# Patient Record
Sex: Male | Born: 1960
Health system: Southern US, Community
[De-identification: ages and names within clinical notes are randomized; demographics above are authoritative.]

## PROBLEM LIST (undated history)

## (undated) DIAGNOSIS — G43909 Migraine, unspecified, not intractable, without status migrainosus: Secondary | ICD-10-CM

## (undated) DIAGNOSIS — F329 Major depressive disorder, single episode, unspecified: Secondary | ICD-10-CM

## (undated) DIAGNOSIS — F32A Depression, unspecified: Secondary | ICD-10-CM

## (undated) DIAGNOSIS — K219 Gastro-esophageal reflux disease without esophagitis: Secondary | ICD-10-CM

## (undated) DIAGNOSIS — J45909 Unspecified asthma, uncomplicated: Secondary | ICD-10-CM

## (undated) DIAGNOSIS — I10 Essential (primary) hypertension: Secondary | ICD-10-CM

## (undated) DIAGNOSIS — J302 Other seasonal allergic rhinitis: Secondary | ICD-10-CM

## (undated) DIAGNOSIS — E785 Hyperlipidemia, unspecified: Secondary | ICD-10-CM

## (undated) DIAGNOSIS — R0600 Dyspnea, unspecified: Secondary | ICD-10-CM

## (undated) DIAGNOSIS — E119 Type 2 diabetes mellitus without complications: Secondary | ICD-10-CM

## (undated) DIAGNOSIS — M1990 Unspecified osteoarthritis, unspecified site: Secondary | ICD-10-CM

## (undated) DIAGNOSIS — I509 Heart failure, unspecified: Secondary | ICD-10-CM

## (undated) DIAGNOSIS — J189 Pneumonia, unspecified organism: Secondary | ICD-10-CM

## (undated) HISTORY — PX: COLONOSCOPY: SHX174

## (undated) HISTORY — PX: OTHER SURGICAL HISTORY: SHX169

## (undated) HISTORY — DX: Migraine, unspecified, not intractable, without status migrainosus: G43.909

## (undated) HISTORY — PX: HERNIA REPAIR: SHX51

---

## 1997-10-05 ENCOUNTER — Ambulatory Visit (HOSPITAL_BASED_OUTPATIENT_CLINIC_OR_DEPARTMENT_OTHER): Admission: RE | Admit: 1997-10-05 | Discharge: 1997-10-05 | Payer: Self-pay | Admitting: Surgery

## 2000-03-06 ENCOUNTER — Emergency Department (HOSPITAL_COMMUNITY): Admission: EM | Admit: 2000-03-06 | Discharge: 2000-03-06 | Payer: Self-pay | Admitting: Emergency Medicine

## 2008-06-15 ENCOUNTER — Encounter: Admission: RE | Admit: 2008-06-15 | Discharge: 2008-06-15 | Payer: Self-pay | Admitting: Emergency Medicine

## 2008-06-22 ENCOUNTER — Inpatient Hospital Stay (HOSPITAL_COMMUNITY): Admission: EM | Admit: 2008-06-22 | Discharge: 2008-06-23 | Payer: Self-pay | Admitting: Family Medicine

## 2008-06-22 ENCOUNTER — Encounter: Payer: Self-pay | Admitting: Emergency Medicine

## 2008-06-22 ENCOUNTER — Ambulatory Visit: Payer: Self-pay | Admitting: Family Medicine

## 2008-07-07 ENCOUNTER — Ambulatory Visit: Payer: Self-pay | Admitting: Gastroenterology

## 2008-08-10 ENCOUNTER — Encounter (INDEPENDENT_AMBULATORY_CARE_PROVIDER_SITE_OTHER): Payer: Self-pay | Admitting: Interventional Radiology

## 2008-08-10 ENCOUNTER — Ambulatory Visit (HOSPITAL_COMMUNITY): Admission: RE | Admit: 2008-08-10 | Discharge: 2008-08-10 | Payer: Self-pay | Admitting: Gastroenterology

## 2008-09-01 ENCOUNTER — Ambulatory Visit: Payer: Self-pay | Admitting: Gastroenterology

## 2009-03-23 ENCOUNTER — Ambulatory Visit: Payer: Self-pay | Admitting: Gastroenterology

## 2010-04-29 LAB — CBC
HCT: 44.5 % (ref 39.0–52.0)
Hemoglobin: 15.4 g/dL (ref 13.0–17.0)
MCHC: 34.7 g/dL (ref 30.0–36.0)
MCV: 94.6 fL (ref 78.0–100.0)
Platelets: 205 10*3/uL (ref 150–400)
RBC: 4.7 MIL/uL (ref 4.22–5.81)
RDW: 12.4 % (ref 11.5–15.5)
WBC: 5.9 10*3/uL (ref 4.0–10.5)

## 2010-04-29 LAB — PROTIME-INR
INR: 1 (ref 0.00–1.49)
Prothrombin Time: 13.3 seconds (ref 11.6–15.2)

## 2010-04-29 LAB — APTT: aPTT: 28 seconds (ref 24–37)

## 2010-04-30 LAB — COMPREHENSIVE METABOLIC PANEL
ALT: 90 U/L — ABNORMAL HIGH (ref 0–53)
ALT: 102 U/L — ABNORMAL HIGH (ref 0–53)
AST: 54 U/L — ABNORMAL HIGH (ref 0–37)
AST: 61 U/L — ABNORMAL HIGH (ref 0–37)
Albumin: 3 g/dL — ABNORMAL LOW (ref 3.5–5.2)
Albumin: 3.2 g/dL — ABNORMAL LOW (ref 3.5–5.2)
Alkaline Phosphatase: 37 U/L — ABNORMAL LOW (ref 39–117)
Alkaline Phosphatase: 41 U/L (ref 39–117)
BUN: 10 mg/dL (ref 6–23)
BUN: 12 mg/dL (ref 6–23)
CO2: 29 mEq/L (ref 19–32)
CO2: 28 mEq/L (ref 19–32)
Calcium: 8.7 mg/dL (ref 8.4–10.5)
Calcium: 8.6 mg/dL (ref 8.4–10.5)
Chloride: 108 mEq/L (ref 96–112)
Chloride: 112 mEq/L (ref 96–112)
Creatinine, Ser: 0.86 mg/dL (ref 0.4–1.5)
Creatinine, Ser: 0.81 mg/dL (ref 0.4–1.5)
GFR calc Af Amer: >60 mL/min (ref >60)
GFR calc Af Amer: >60 mL/min (ref >60)
GFR calc non Af Amer: >60 mL/min (ref >60)
GFR calc non Af Amer: >60 mL/min (ref >60)
Glucose, Bld: 94 mg/dL (ref 70–99)
Glucose, Bld: 86 mg/dL (ref 70–99)
Potassium: 4 mEq/L (ref 3.5–5.1)
Potassium: 3.8 mEq/L (ref 3.5–5.1)
Sodium: 143 mEq/L (ref 135–145)
Sodium: 143 mEq/L (ref 135–145)
Total Bilirubin: 1.1 mg/dL (ref 0.3–1.2)
Total Bilirubin: 1.2 mg/dL (ref 0.3–1.2)
Total Protein: 5.9 g/dL — ABNORMAL LOW (ref 6.0–8.3)
Total Protein: 5.9 g/dL — ABNORMAL LOW (ref 6.0–8.3)

## 2010-04-30 LAB — URINALYSIS, ROUTINE W REFLEX MICROSCOPIC
Bilirubin Urine: NEGATIVE
Glucose, UA: NEGATIVE mg/dL
Hgb urine dipstick: NEGATIVE
Ketones, ur: NEGATIVE mg/dL
Nitrite: NEGATIVE
Protein, ur: NEGATIVE mg/dL
Specific Gravity, Urine: 1.008 (ref 1.005–1.030)
Urobilinogen, UA: 0.2 mg/dL (ref 0.0–1.0)
pH: 8.5 — ABNORMAL HIGH (ref 5.0–8.0)

## 2010-04-30 LAB — CBC
HCT: 40.6 % (ref 39.0–52.0)
HCT: 41.8 % (ref 39.0–52.0)
Hemoglobin: 14.1 g/dL (ref 13.0–17.0)
Hemoglobin: 14.2 g/dL (ref 13.0–17.0)
MCHC: 34.6 g/dL (ref 30.0–36.0)
MCHC: 33.9 g/dL (ref 30.0–36.0)
MCV: 93.2 fL (ref 78.0–100.0)
MCV: 93.7 fL (ref 78.0–100.0)
Platelets: 154 10*3/uL (ref 150–400)
Platelets: 160 10*3/uL (ref 150–400)
RBC: 4.36 MIL/uL (ref 4.22–5.81)
RBC: 4.47 MIL/uL (ref 4.22–5.81)
RDW: 12.3 % (ref 11.5–15.5)
RDW: 12.4 % (ref 11.5–15.5)
WBC: 5.4 10*3/uL (ref 4.0–10.5)
WBC: 6.6 10*3/uL (ref 4.0–10.5)

## 2010-04-30 LAB — DIFFERENTIAL
Basophils Absolute: 0 10*3/uL (ref 0.0–0.1)
Basophils Relative: 0 % (ref 0–1)
Eosinophils Absolute: 0.2 10*3/uL (ref 0.0–0.7)
Eosinophils Relative: 2 % (ref 0–5)
Lymphocytes Relative: 34 % (ref 12–46)
Lymphs Abs: 2.2 10*3/uL (ref 0.7–4.0)
Monocytes Absolute: 0.4 10*3/uL (ref 0.1–1.0)
Monocytes Relative: 6 % (ref 3–12)
Neutro Abs: 3.8 10*3/uL (ref 1.7–7.7)
Neutrophils Relative %: 57 % (ref 43–77)

## 2010-04-30 LAB — PROTIME-INR
INR: 1.1 (ref 0.00–1.49)
Prothrombin Time: 14.7 seconds (ref 11.6–15.2)

## 2010-04-30 LAB — SEDIMENTATION RATE: Sed Rate: 2 mm/hr (ref 0–16)

## 2010-04-30 LAB — APTT: aPTT: 28 seconds (ref 24–37)

## 2010-04-30 LAB — LIPASE, BLOOD: Lipase: 15 U/L (ref 11–59)

## 2010-04-30 LAB — TSH: TSH: 0.717 u[IU]/mL (ref 0.350–4.500)

## 2010-06-05 NOTE — Discharge Summary (Signed)
NAMEJOSHAU, CODE               ACCOUNT NO.:  [id number]   MEDICAL RECORD NO.:  [id number]          PATIENT TYPE:  INP   LOCATION:  3730                         FACILITY:  [facility]   PHYSICIAN:  Santiago Bumpers. Hensel, M.D.DATE OF BIRTH:  Jul 19, 1960   DATE OF ADMISSION:  06/22/2008  DATE OF DISCHARGE:  06/23/2008                               DISCHARGE SUMMARY   PRIMARY CARE Cora Stetson:  Brett Canales A. Cleta Alberts, MD at [facility].   DISCHARGE DIAGNOSES:  1. Vertigo, resolved.  2. Chronic hepatitis C.   DISCHARGE MEDICATIONS:  None.   DISCONTINUED MEDICATIONS:  None.   PROCEDURES:  1. Acute abdominal series were unremarkable.  2. Two-view chest x-ray shows no acute findings.   LABORATORY DATA:  1. ESR 2.  2. TSH 0.717.  3. Complete metabolic panel:  Creatinine of 0.86.  Liver enzymes      mildly elevated at alkaline phosphatase 37, AST 54, ALT 90, total      protein 5.9, blood albumin 3, calcium 8.7, total bilirubin 1.1.      These were labs on day of discharge and showed an improvement and      showed a slight decrease in liver enzymes from one drawn on      admission.  4. PT 1.1.  5. Lipase 50.  6. Urinalysis negative for proteins, lymphocytes, nitrites.   BRIEF HOSPITAL COURSE:  Please see dictated H and P for full admission  details.  In brief, this is a 50 year old male with no significant past  history who presented to the hospital with several weeks of increasing  weakness and an acute episode of severe dizziness yesterday.   PROBLEMS:  1. Vertigo:  The patient was admitted and put on telemetry.  The      patient's vertigo soon resolved after some rehydration and good      p.o. intake.  Cardiac monitoring, EKG, labs did not show any causes      concerning for acute vertigo.  Possibly, the patient had an acute      viral infection superimposed on chronic hepatitis C, which would      explain slightly increased LFTs and possible viral labyrinthitis as      cause of his  dizziness.  The patient's symptoms not consistent with      benign positional vertigo.  2. Hepatitis C:  The patient's LFTs were slightly elevated on      admission.  No evidence of liver failure.  The patient will follow      up with his primary care physician to be set up to be referred to      [facility] for consultation for further treatment options.   FOLLOWUP APPOINTMENTS:  The patient is instructed to follow up with Dr.  Cleta Alberts in the next week.   The patient discharged to home in stable condition.      Delbert Harness, MD  Electronically Signed      Santiago Bumpers. Leveda Anna, M.D.  Electronically Signed    KB/MEDQ  D:  06/23/2008  T:  06/24/2008  Job:  [id number]  cc:   Stan Head. Cleta Alberts, M.D.

## 2010-06-05 NOTE — H&P (Signed)
Jonathan Grant, Jonathan Grant               ACCOUNT NO.:  [id number]   MEDICAL RECORD NO.:  [id number]          PATIENT TYPE:  INP   LOCATION:  3730                         FACILITY:  [facility]   PHYSICIAN:  Santiago Bumpers. Hensel, M.D.DATE OF BIRTH:  1960-08-30   DATE OF ADMISSION:  06/22/2008  DATE OF DISCHARGE:  06/23/2008                              HISTORY & PHYSICAL   PRIMARY CARE PHYSICIAN:  Brett Canales A. Cleta Alberts, MD, [facility]   CHIEF COMPLAINT:  Weakness and dizziness.   HISTORY OF PRESENT ILLNESS:  Mr. Knerr is 50 year old male with no  significant past medical history until yesterday when he was diagnosed  with hepatitis C.  He states he went to Dr. Cleta Alberts for his yearly work  physical little over a month ago and told him he had not been feeling  well.  He is feeling very warm down and extremely fatigued.  He was  started and Lexapro and blood work was drawn.  The patient states his  liver test and testosterone came back abnormal, so they sent more blood.  They thought he possibly had liver cancer, but then yesterday he told  them he had hepatitis C.  He also started the Lexapro about 1 week ago  because he had been feeling increasingly lightheaded and weak for the  last weeks.  Last night, he was mowing with his tractor, but he could  not finish because he felt so weak.  He went inside and ate dinner and  later his wife told him he looked flushed.  This morning, he woke at 6  a.m. and had severe dizziness and sensation of the room spinning and was  very nauseated.  He states he could barely walk.  He already had  followup appointment scheduled with Dr. Cleta Alberts, but when he arrived he was  so weak and dizzy he had to take a wheelchair to get inside, so we sent  him to the emergency department.   REVIEW OF SYSTEMS:  Positive for 28-pound weight loss since January;  however, he has been trying to lose weight.  He is also positive for hot  flushes or sweating and  constipation.  He is negative for cough,  shortness of breath, fevers, vomiting, diarrhea, dysuria, headache,  chest pain, and abdominal pain.   PAST MEDICAL HISTORY:  Hepatitis C.   MEDICATIONS:  Multivitamin daily and fish oil daily.   ALLERGIES:  He has no known drug allergies.   FAMILY HISTORY:  His father died from cirrhosis of the liver, but was a  heavy drinker.  His mother, siblings, and children are all healthy.   SOCIAL HISTORY:  He is married with 3 children, age is 96, 12, and 8.  He lives in [place].  He works as a Psychologist, occupational.  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  He smoked various  tobacco products between the ages of 84 and 31 and used various types of  IV drugs between ages of 47 and 89.   PHYSICAL EXAMINATION:  VITAL SIGNS:  Temperature 97.2, pulse 54,  respirations 20, blood pressure 119/77, and oxygen saturation 100% on  room air.  GENERAL:  He is a thin male in no acute distress.  HEENT:  Head is normocephalic and atraumatic with extraocular movements  intact.  Clear sclerae.  Mucous membranes are moist and his oropharynx  is clear.  NECK:  Supple with full range of motion and no lymphadenopathy.  CARDIOVASCULAR:  Sinus bradycardiac without murmur.  No JVD.  He has 2+  peripheral pulses.  LUNGS:  Clear to auscultation bilaterally.  ABDOMEN:  Soft, nontender, and nondistended with positive bowel sounds.  No hepatosplenomegaly.  SKIN:  He has good tone and color.  No jaundice.  No lesions.  EXTREMITIES:  No cyanosis, clubbing, or edema.   LABORATORY DATA:  The UA was normal with a specific gravity of 1.008.  His white count is 6.6, hemoglobin 14.2, platelets 160 with a normal  differential.  BMET shows the BUN of 12, creatinine is 0.81.  Liver  functions are normal except with mildly elevated AST at 61 and mildly  elevated ALT at 102.  He had a lipase that was normal at 15 and normal  INR of 1.1.  He had an acute abdominal  series, which was negative and ab  abdominal ultrasound on Jun 15, 2008, which was normal.   ASSESSMENT AND PLAN:  This is a 50 year old male with recently diagnosed  hepatitis C with dizziness and weakness.  1. Dizziness and weakness.  The etiology is unclear, could be      dehydration versus vertigo, versus malignancy.  Hepatitis is not      acute and does not necessarily explain this.  His albumin is down,      but his INR is normal.  He does not appear to have severe liver      dysfunction.  His abdominal ultrasound showed normal liver.  I will      give him a regular diet and IV fluids.  If dizziness returns, we      will try meclizine.  We will check a TSH and a sed rate.  If this      is not improving, we may need to consider more workup for possible      malignant process.  This seems to have indolently set in over the      last few weeks to months.  He does state that he saw something on      his chest x-ray that we will get a two-view chest x-ray in the      morning.  2. Bradycardia.  He is relatively young and has been exercising more      up until 2 weeks, we will check TSH and EKG.  3. Hepatitis C.  This is a new diagnosis and no indication for      treatment currently.  His primary MD is arranging followup with the      [facility].   DISPOSITION:  Pain resolution of his symptoms and any further workup  deemed necessary.      Ardeen Garland, MD  Electronically Signed      Santiago Bumpers. Leveda Anna, M.D.  Electronically Signed    LM/MEDQ  D:  06/23/2008  T:  06/24/2008  Job:  [id number]

## 2011-06-20 ENCOUNTER — Other Ambulatory Visit: Payer: Self-pay | Admitting: Dermatology

## 2011-10-31 DIAGNOSIS — K648 Other hemorrhoids: Secondary | ICD-10-CM | POA: Insufficient documentation

## 2015-06-03 ENCOUNTER — Emergency Department (HOSPITAL_COMMUNITY)
Admission: EM | Admit: 2015-06-03 | Discharge: 2015-06-03 | Disposition: A | Discharge status: Home / Self Care | Payer: Self-pay | Attending: Emergency Medicine | Admitting: Emergency Medicine

## 2015-06-03 ENCOUNTER — Encounter (HOSPITAL_COMMUNITY): Payer: Self-pay | Admitting: *Deleted

## 2015-06-03 ENCOUNTER — Emergency Department (HOSPITAL_COMMUNITY): Payer: Self-pay

## 2015-06-03 DIAGNOSIS — Y929 Unspecified place or not applicable: Secondary | ICD-10-CM | POA: Insufficient documentation

## 2015-06-03 DIAGNOSIS — F329 Major depressive disorder, single episode, unspecified: Secondary | ICD-10-CM | POA: Insufficient documentation

## 2015-06-03 DIAGNOSIS — Y939 Activity, unspecified: Secondary | ICD-10-CM | POA: Insufficient documentation

## 2015-06-03 DIAGNOSIS — S0101XA Laceration without foreign body of scalp, initial encounter: Secondary | ICD-10-CM | POA: Insufficient documentation

## 2015-06-03 DIAGNOSIS — W19XXXA Unspecified fall, initial encounter: Secondary | ICD-10-CM

## 2015-06-03 DIAGNOSIS — W0110XA Fall on same level from slipping, tripping and stumbling with subsequent striking against unspecified object, initial encounter: Secondary | ICD-10-CM | POA: Insufficient documentation

## 2015-06-03 DIAGNOSIS — Y999 Unspecified external cause status: Secondary | ICD-10-CM | POA: Insufficient documentation

## 2015-06-03 HISTORY — DX: Depression, unspecified: F32.A

## 2015-06-03 HISTORY — DX: Major depressive disorder, single episode, unspecified: F32.9

## 2015-06-03 MED ORDER — BACITRACIN-NEOMYCIN-POLYMYXIN 400-5-5000 EX OINT
TOPICAL_OINTMENT | CUTANEOUS | Status: AC
  Filled 2015-06-03: qty 1

## 2015-06-03 MED ORDER — LIDOCAINE-EPINEPHRINE (PF) 1 %-1:200000 IJ SOLN
INTRAMUSCULAR | Status: AC
  Filled 2015-06-03: qty 30

## 2015-06-03 MED ORDER — LIDOCAINE-EPINEPHRINE (PF) 1 %-1:200000 IJ SOLN
20.0000 mL | Freq: Once | INTRAMUSCULAR | Status: DC
  Filled 2015-06-03: qty 20

## 2015-06-03 MED ORDER — OXYCODONE-ACETAMINOPHEN 5-325 MG PO TABS: 1.0000 | ORAL_TABLET | ORAL | Status: DC | PRN

## 2015-06-03 NOTE — ED Provider Notes (Signed)
CSN: [id number]     Arrival date & time 06/03/15  [id number] History  By signing my name below, I, Emmanuella Mensah, attest that this documentation has been prepared under the direction and in the presence of Donnetta HutchingBrian Logyn Dedominicis, MD. Electronically Signed: Angelene GiovanniEmmanuella Mensah, ED Scribe. 06/03/2015. 8:19 AM.      Chief Complaint  Patient presents with  . Fall   The history is provided by the patient. No language interpreter was used.   HPI Comments: Jonathan Grant is a 55 y.o. male who presents to the Emergency Department for evaluation s/p accidental mechanical fall that occurred at work PTA. 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 No LOC or neurological deficits. He presents with a laceration to his forehead. Pt denies any other injuries.    Past Medical History  Diagnosis Date  . Depression    Past Surgical History  Procedure Laterality Date  . Hernia repair     No family history on file. Social History  Substance Use Topics  . Smoking status: Never Smoker   . Smokeless tobacco: None  . Alcohol Use: No    Review of Systems  Skin: Positive for wound.  All other systems reviewed and are negative.     Allergies  Review of patient's allergies indicates no known allergies.  Home Medications   Prior to Admission medications   Medication Sig Start Date End Date Taking? Authorizing Provider  citalopram (CELEXA) 40 MG tablet Take 40 mg by mouth daily. 05/15/15   Historical Provider, MD   BP 114/77 mmHg  Pulse 52  Temp(Src) 98 F (36.7 C) (Oral)  Resp 18  Ht 5\' 7"  (1.702 m)  Wt 247 lb (112.038 kg)  BMI 38.68 kg/m2  SpO2 96% Physical Exam  Constitutional: He is oriented to person, place, and time. He appears well-developed and well-nourished.  HENT:  Head: Normocephalic and atraumatic.  Macerated 5 cm central laceration   Eyes: Conjunctivae and EOM are normal. Pupils are equal, round, and reactive to light.  Neck: Normal range of motion. Neck supple.  Tender  posteriorly on cervical spine  Cardiovascular: Normal rate and regular rhythm.   Pulmonary/Chest: Effort normal and breath sounds normal.  Abdominal: Soft. Bowel sounds are normal.  Musculoskeletal: Normal range of motion.  Neurological: He is alert and oriented to person, place, and time.  Skin: Skin is warm and dry.  Psychiatric: He has a normal mood and affect. His behavior is normal.  Nursing note and vitals reviewed.   ED Course  Procedures (including critical care time) DIAGNOSTIC STUDIES: Oxygen Saturation is 94% on RA, adequate by my interpretation.    COORDINATION OF CARE: 8:12 AM- Pt advised of plan for treatment and pt agrees. Pt will receive a head, face, and neck CT. He will also receive a laceration repair.    Labs Review Labs Reviewed - No data to display  Imaging Review Ct Head Wo Contrast  06/03/2015  CLINICAL DATA:  Pain after fall. EXAM: CT HEAD WITHOUT CONTRAST CT MAXILLOFACIAL WITHOUT CONTRAST CT CERVICAL SPINE WITHOUT CONTRAST TECHNIQUE: Multidetector CT imaging of the head, cervical spine, and maxillofacial structures were performed using the standard protocol without intravenous contrast. Multiplanar CT image reconstructions of the cervical spine and maxillofacial structures were also generated. COMPARISON:  None. FINDINGS: CT HEAD FINDINGS Paranasal sinuses, mastoid air cells, and middle ears are normal. No fractures are seen on the brain CT. Soft tissue swelling and laceration is seen over the right forehead. High  density material along the skin surface and just below the skin surface as seen on series 2, image 11, probably foreign body. There is a small amount of air in the subcutaneous tissues as well. The orbits and remainder of the soft tissues are normal. No subdural, epidural, or subarachnoid hemorrhage. No mass, mass effect, or midline shift. Ventricles and sulci are normal for age. The cerebellum, brainstem, and basal cisterns are normal. No acute cortical  ischemia or infarct. No mass, mass effect, or midline shift. CT MAXILLOFACIAL FINDINGS Again noted is soft tissue swelling over the right forehead with foci of air in the subcutaneous tissues but no underlying fracture. Foci of high attenuation along and under the surface of the skin suggests foreign bodies. The globes and orbits are intact. The remainder of the soft tissues are normal. Minimal mucosal thickening is seen in the paranasal sinuses with no air-fluid levels. The mastoid air cells and middle ears are well aerated. No fractures are seen in the frontal bone underlying the soft tissue abnormalities described above. No fractures are seen in the facial bones. CT CERVICAL SPINE FINDINGS There is mild reversal of normal lordosis centered at C5-6. No traumatic malalignment is identified. No fracture or malalignment is identified. Multilevel degenerative changes are seen most marked at C6-7. Soft tissues of the neck are within normal limits. The lung apices demonstrate no abnormalities. IMPRESSION: 1. No acute intracranial process. 2. Soft tissue swelling and laceration over the right forehead with foci of air in the underlying soft tissues. High attenuation foci in the underlying soft tissue suggests foreign body. No facial bone fractures. 3. No traumatic fracture or malalignment in the cervical spine. Degenerative changes. Electronically Signed   By: Gerome Sam III M.D   On: 06/03/2015 09:03   Ct Cervical Spine Wo Contrast  06/03/2015  CLINICAL DATA:  Pain after fall. EXAM: CT HEAD WITHOUT CONTRAST CT MAXILLOFACIAL WITHOUT CONTRAST CT CERVICAL SPINE WITHOUT CONTRAST TECHNIQUE: Multidetector CT imaging of the head, cervical spine, and maxillofacial structures were performed using the standard protocol without intravenous contrast. Multiplanar CT image reconstructions of the cervical spine and maxillofacial structures were also generated. COMPARISON:  None. FINDINGS: CT HEAD FINDINGS Paranasal sinuses,  mastoid air cells, and middle ears are normal. No fractures are seen on the brain CT. Soft tissue swelling and laceration is seen over the right forehead. High density material along the skin surface and just below the skin surface as seen on series 2, image 11, probably foreign body. There is a small amount of air in the subcutaneous tissues as well. The orbits and remainder of the soft tissues are normal. No subdural, epidural, or subarachnoid hemorrhage. No mass, mass effect, or midline shift. Ventricles and sulci are normal for age. The cerebellum, brainstem, and basal cisterns are normal. No acute cortical ischemia or infarct. No mass, mass effect, or midline shift. CT MAXILLOFACIAL FINDINGS Again noted is soft tissue swelling over the right forehead with foci of air in the subcutaneous tissues but no underlying fracture. Foci of high attenuation along and under the surface of the skin suggests foreign bodies. The globes and orbits are intact. The remainder of the soft tissues are normal. Minimal mucosal thickening is seen in the paranasal sinuses with no air-fluid levels. The mastoid air cells and middle ears are well aerated. No fractures are seen in the frontal bone underlying the soft tissue abnormalities described above. No fractures are seen in the facial bones. CT CERVICAL SPINE FINDINGS There is mild reversal  of normal lordosis centered at C5-6. No traumatic malalignment is identified. No fracture or malalignment is identified. Multilevel degenerative changes are seen most marked at C6-7. Soft tissues of the neck are within normal limits. The lung apices demonstrate no abnormalities. IMPRESSION: 1. No acute intracranial process. 2. Soft tissue swelling and laceration over the right forehead with foci of air in the underlying soft tissues. High attenuation foci in the underlying soft tissue suggests foreign body. No facial bone fractures. 3. No traumatic fracture or malalignment in the cervical spine.  Degenerative changes. Electronically Signed   By: Gerome Sam III M.D   On: 06/03/2015 09:03   Ct Maxillofacial Wo Cm  06/03/2015  CLINICAL DATA:  Pain after fall. EXAM: CT HEAD WITHOUT CONTRAST CT MAXILLOFACIAL WITHOUT CONTRAST CT CERVICAL SPINE WITHOUT CONTRAST TECHNIQUE: Multidetector CT imaging of the head, cervical spine, and maxillofacial structures were performed using the standard protocol without intravenous contrast. Multiplanar CT image reconstructions of the cervical spine and maxillofacial structures were also generated. COMPARISON:  None. FINDINGS: CT HEAD FINDINGS Paranasal sinuses, mastoid air cells, and middle ears are normal. No fractures are seen on the brain CT. Soft tissue swelling and laceration is seen over the right forehead. High density material along the skin surface and just below the skin surface as seen on series 2, image 11, probably foreign body. There is a small amount of air in the subcutaneous tissues as well. The orbits and remainder of the soft tissues are normal. No subdural, epidural, or subarachnoid hemorrhage. No mass, mass effect, or midline shift. Ventricles and sulci are normal for age. The cerebellum, brainstem, and basal cisterns are normal. No acute cortical ischemia or infarct. No mass, mass effect, or midline shift. CT MAXILLOFACIAL FINDINGS Again noted is soft tissue swelling over the right forehead with foci of air in the subcutaneous tissues but no underlying fracture. Foci of high attenuation along and under the surface of the skin suggests foreign bodies. The globes and orbits are intact. The remainder of the soft tissues are normal. Minimal mucosal thickening is seen in the paranasal sinuses with no air-fluid levels. The mastoid air cells and middle ears are well aerated. No fractures are seen in the frontal bone underlying the soft tissue abnormalities described above. No fractures are seen in the facial bones. CT CERVICAL SPINE FINDINGS There is mild  reversal of normal lordosis centered at C5-6. No traumatic malalignment is identified. No fracture or malalignment is identified. Multilevel degenerative changes are seen most marked at C6-7. Soft tissues of the neck are within normal limits. The lung apices demonstrate no abnormalities. IMPRESSION: 1. No acute intracranial process. 2. Soft tissue swelling and laceration over the right forehead with foci of air in the underlying soft tissues. High attenuation foci in the underlying soft tissue suggests foreign body. No facial bone fractures. 3. No traumatic fracture or malalignment in the cervical spine. Degenerative changes. Electronically Signed   By: Gerome Sam III M.D   On: 06/03/2015 09:03     Donnetta Hutching, MD has personally reviewed and evaluated these images and lab results as part of his medical decision-making.   EKG Interpretation None      MDM   Final diagnoses:  Laceration of scalp, initial encounter  Fall, initial encounter    Laceration repair per physician assistant note. Discussed possibility of a small foreign body in wound. No neurological deficits.    I personally performed the services described in this documentation, which was scribed in my presence.  The recorded information has been reviewed and is accurate.    Donnetta Hutching, MD 06/03/15 [phone]

## 2015-06-03 NOTE — Discharge Instructions (Signed)
Suture out next Thursday. Keep clean and dry for 2 days. Then can change dressing daily. You may have a small foreign body in your forehead.   Watch for signs of infection, fever, pus, increasing pain

## 2015-06-03 NOTE — ED Provider Notes (Signed)
Patients forehead lacerations were repaired by me per Dr. Patsey Bertholdook's request.  LACERATION REPAIR Performed by: Burgess AmorIDOL, Dametri Ozburn Authorized by: Burgess AmorIDOL, Shene Maxfield Consent: Verbal consent obtained. Risks and benefits: risks, benefits and alternatives were discussed Consent given by: patient Patient identity confirmed: provided demographic data Prepped and Draped in normal sterile fashion Wound explored  Laceration Location: right forehead  Laceration Length:  Multiple stellate branches, approx 8 cm total lacerated length   very dirty wound, mud, sand.  Copious flushed and scrubbed, first using peroxide, then saline and abrasive 4x4's.    Anesthesia: local infiltration and right frontal nerve block  Local anesthetic: lidocaine 2% with epinephrine  Anesthetic total: 3 ml  Irrigation method: syringe Amount of cleaning: standard  Skin closure: ethilon 5-0  Number of sutures: 15  Technique: simple interupted.  Patient tolerance: Patient tolerated the procedure well with no immediate complications.   Complicated wound requiring debridement and several areas of edge revision.  Burgess AmorJulie Hondo Nanda, PA-C 06/03/15 1023  Donnetta HutchingBrian Cook, MD 06/03/15 1316

## 2015-06-03 NOTE — ED Notes (Addendum)
Pt was at work when he was standing on a piece of machinery. Pt slipped and fell. He fell approx. 6-8 feel onto his face and head. Pt is only having pain in his face and head at this time. Pt denies any loss of consciousness or blood thinner use. Pt alert and oriented at this time. Pt has abrasion to right forehead.   Pt alert and oriented at this time.

## 2017-02-17 DIAGNOSIS — J209 Acute bronchitis, unspecified: Secondary | ICD-10-CM | POA: Diagnosis not present

## 2017-02-17 DIAGNOSIS — J111 Influenza due to unidentified influenza virus with other respiratory manifestations: Secondary | ICD-10-CM | POA: Diagnosis not present

## 2017-07-01 ENCOUNTER — Encounter: Payer: Self-pay | Admitting: Family Medicine

## 2017-07-11 ENCOUNTER — Encounter: Payer: Self-pay | Admitting: Family Medicine

## 2017-08-21 ENCOUNTER — Ambulatory Visit: Payer: Self-pay | Admitting: Family Medicine

## 2017-09-05 ENCOUNTER — Ambulatory Visit: Payer: BLUE CROSS/BLUE SHIELD | Admitting: Family Medicine

## 2017-09-05 ENCOUNTER — Encounter: Payer: Self-pay | Admitting: Family Medicine

## 2017-09-05 VITALS — BP 118/80 | HR 94 | Resp 16 | Ht 66.5 in | Wt 233.0 lb

## 2017-09-05 DIAGNOSIS — F339 Major depressive disorder, recurrent, unspecified: Secondary | ICD-10-CM | POA: Diagnosis not present

## 2017-09-05 DIAGNOSIS — Z23 Encounter for immunization: Secondary | ICD-10-CM

## 2017-09-05 DIAGNOSIS — F329 Major depressive disorder, single episode, unspecified: Secondary | ICD-10-CM | POA: Insufficient documentation

## 2017-09-05 DIAGNOSIS — E349 Endocrine disorder, unspecified: Secondary | ICD-10-CM | POA: Insufficient documentation

## 2017-09-05 DIAGNOSIS — F32A Depression, unspecified: Secondary | ICD-10-CM | POA: Insufficient documentation

## 2017-09-05 DIAGNOSIS — G43909 Migraine, unspecified, not intractable, without status migrainosus: Secondary | ICD-10-CM | POA: Insufficient documentation

## 2017-09-05 DIAGNOSIS — R5383 Other fatigue: Secondary | ICD-10-CM | POA: Diagnosis not present

## 2017-09-05 DIAGNOSIS — B1920 Unspecified viral hepatitis C without hepatic coma: Secondary | ICD-10-CM | POA: Insufficient documentation

## 2017-09-05 MED ORDER — CITALOPRAM HYDROBROMIDE 20 MG PO TABS: 20.0000 mg | ORAL_TABLET | Freq: Every day | ORAL | 0 refills | Status: DC

## 2017-09-05 NOTE — Progress Notes (Signed)
Patient ID: Jonathan Grant Bussey, male    DOB: [birth year], 57 y.o.   MRN: [id number]  Chief Complaint  Patient presents with  . Establish Care  . Depression    Allergies Patient has no known allergies.  Subjective:   Jonathan Grant Mccay is a 57 y.o. male who presents to [facility] today.  HPI Here to establish care. Reports that has been off of his celexa for 6-12 months and would like to get back on it. Feels that mood is not as good. Energy is low. Works a lot. Feels like does not concentrate well. Appetite is good.  Had previously been on celexa 40 mg a day. Ended up running out of the medication and would like to be back on it.  Does not sleep well b/c wakes up at 3 am and mind is racing and cannot shut off brain. Can be irritable and more anxious. No suicide ideation. Reports that wife would like him back on the medication b/c he has bee irritable and more moody.  Works about 60 hours a week. Does not get much time to himself. Does have some financial stress.  Denies any suicidal or homicidal ideations.  Has never been hospitalized for his mood.  Has never been on any other medication other than Celexa.  Does have a family history of schizophrenia and depression.  Did not have any side effects with the Celexa other than it decreased his sex drive.  He reports this was not a problem because his wife is menopausal.  Reports that had a history of HCV and HBV and was [facility] and [facility] the medication. Reports that does not have to follow up with them at all b/c of cure.  Has no history of hypertension, hyperlipidemia, or diabetes.  Does get a lot of walking with his job.  Reports he does not eat very healthy.  Denies any snoring.   Past Medical History:  Diagnosis Date  . Depression   . Migraine     Past Surgical History:  Procedure Laterality Date  . HERNIA REPAIR      Family History  Problem Relation Age of Onset  . Dementia Mother   . Schizophrenia  Mother      Social History   Socioeconomic History  . Marital status: Married    Spouse name: Not on file  . Number of children: 3  . Years of education: Not on file  . Highest education level: 12th grade  Occupational History  . Not on file  Social Needs  . Financial resource strain: Not on file  . Food insecurity:    Worry: Not on file    Inability: Not on file  . Transportation needs:    Medical: Not on file    Non-medical: Not on file  Tobacco Use  . Smoking status: Never Smoker  . Smokeless tobacco: Never Used  Substance and Sexual Activity  . Alcohol use: No  . Drug use: No  . Sexual activity: Not on file  Lifestyle  . Physical activity:    Days per week: Not on file    Minutes per session: Not on file  . Stress: Not on file  Relationships  . Social connections:    Talks on phone: Not on file    Gets together: Not on file    Attends religious service: Not on file    Active member of club or organization: Not on file    Attends meetings  of clubs or organizations: Not on file    Relationship status: Not on file  Other Topics Concern  . Not on file  Social History Narrative   Lives in [place], [place].    Eats all food groups.    Pastors a church.   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        Review of Systems  Constitutional: Negative for appetite change, chills, fever and unexpected weight change.  HENT: Negative for trouble swallowing and voice change.   Eyes: Negative for visual disturbance.  Respiratory: Negative for cough, chest tightness, shortness of breath and wheezing.   Cardiovascular: Negative for chest pain, palpitations and leg swelling.  Gastrointestinal: Negative for abdominal pain, diarrhea, nausea and vomiting.  Genitourinary: Negative for decreased urine volume, dysuria and frequency.  Musculoskeletal: Negative for back pain and myalgias.  Skin: Negative for rash.  Neurological: Negative for dizziness, tremors, syncope,  facial asymmetry, weakness and headaches.  Hematological: Negative for adenopathy. Does not bruise/bleed easily.  Psychiatric/Behavioral: Positive for decreased concentration, dysphoric mood and sleep disturbance. Negative for agitation, behavioral problems, confusion, hallucinations, self-injury and suicidal ideas. The patient is nervous/anxious. The patient is not hyperactive.      Objective:   BP 118/80   Pulse 94   Resp 16   Ht 5' 6.5" (1.689 m)   Wt 233 lb (105.7 kg)   SpO2 98%   BMI 37.04 kg/m   Physical Exam  Constitutional: He appears well-developed and well-nourished.  HENT:  Head: Normocephalic and atraumatic.  Eyes: Pupils are equal, round, and reactive to light. Conjunctivae are normal.  Neck: Normal range of motion. Neck supple.  Cardiovascular: Normal rate, regular rhythm and normal heart sounds.  Pulmonary/Chest: Effort normal and breath sounds normal.  Skin: Skin is warm and dry. Capillary refill takes less than 2 seconds. No erythema.  Psychiatric: He has a normal mood and affect. His behavior is normal. Judgment and thought content normal. His mood appears not anxious. His affect is not blunt and not labile. His speech is not rapid and/or pressured. Cognition and memory are normal. He does not exhibit a depressed mood. He expresses no homicidal and no suicidal ideation. He expresses no suicidal plans and no homicidal plans.  Vitals reviewed.  Depression screen PHQ 2/9 09/05/2017  Decreased Interest 2  Down, Depressed, Hopeless 2  PHQ - 2 Score 4  Altered sleeping 2  Tired, decreased energy 2  Change in appetite 2  Feeling bad or failure about yourself  1  Trouble concentrating 2  Moving slowly or fidgety/restless 2  Suicidal thoughts 0  PHQ-9 Score 15  Difficult doing work/chores Somewhat difficult    Assessment and Plan  1. Depression, recurrent (HCC) Restart celexa 20 mg a day.  Patient counseled in detail regarding the risks of medication. Told to  call or return to clinic if develop any worrisome signs or symptoms. Patient voiced understanding.  Suicide risks evaluated and documented in note if present or in the area below.  Patient does not have/denies the following risks: previous suicide attempts, family history of suicide, access to lethal means, prior history of psychiatric disorder, history of alcohol or substance abuse disorder, recent loss of a loved one, or severe hopelessness. Patient denies access to firearms or if present will have removed from home/access.   Patient has protective factors of family and community support.  Patient reports that family believes is behaving rationally. Patient displays problem solving skills.   Patient specifically denies  suicide ideation. Patient has access/information to healthcare contacts if situation or mood changes where patient is a risk to self or others or mood becomes unstable.   During the encounter, the patient had good eye contact and firm handshake regarding safety contract and agreement to seek help if mood worsens and not to harm self.   Patient understands the treatment plan and is in agreement. Agrees to keep follow up and call prior or return to clinic if needed.   Patient will RTC with new PCP office for CPE. Check labs today.   2. Fatigue, unspecified type Suspect secondary to mood/sleep disturbance.The patient is asked to make an attempt to improve diet and exercise patterns to aid in medical management of this problem.  - COMPLETE METABOLIC PANEL WITH GFR - CBC with Differential/Platelet - TSH - VITAMIN D 25 Hydroxy (Vit-D Deficiency, Fractures) - Vitamin B12  Return in about 4 weeks (around 10/03/2017) for follow up. Aliene Beamsachel Adalin Vanderploeg, MD 09/05/2017

## 2017-09-05 NOTE — Patient Instructions (Signed)
[facility], [place] [facility]. [address]

## 2017-09-06 LAB — COMPLETE METABOLIC PANEL WITH GFR
AG Ratio: 1.8 (calc) (ref 1.0–2.5)
ALT: 16 U/L (ref 9–46)
AST: 22 U/L (ref 10–35)
Albumin: 4.4 g/dL (ref 3.6–5.1)
Alkaline phosphatase (APISO): 71 U/L (ref 40–115)
BUN/Creatinine Ratio: 28 (calc) — ABNORMAL HIGH (ref 6–22)
BUN: 28 mg/dL — ABNORMAL HIGH (ref 7–25)
CO2: 28 mmol/L (ref 20–32)
Calcium: 9.3 mg/dL (ref 8.6–10.3)
Chloride: 105 mmol/L (ref 98–110)
Creat: 0.99 mg/dL (ref 0.70–1.33)
GFR, Est African American: 99 mL/min/{1.73_m2} (ref >=60)
GFR, Est Non African American: 85 mL/min/{1.73_m2} (ref >=60)
Globulin: 2.4 g/dL (calc) (ref 1.9–3.7)
Glucose, Bld: 79 mg/dL (ref 65–139)
Potassium: 4.1 mmol/L (ref 3.5–5.3)
Sodium: 139 mmol/L (ref 135–146)
Total Bilirubin: 0.8 mg/dL (ref 0.2–1.2)
Total Protein: 6.8 g/dL (ref 6.1–8.1)

## 2017-09-06 LAB — CBC WITH DIFFERENTIAL/PLATELET
Basophils Absolute: 31 cells/uL (ref 0–200)
Basophils Relative: 0.6 %
Eosinophils Absolute: 140 cells/uL (ref 15–500)
Eosinophils Relative: 2.7 %
HCT: 41.7 % (ref 38.5–50.0)
Hemoglobin: 14.2 g/dL (ref 13.2–17.1)
Lymphs Abs: 1804 cells/uL (ref 850–3900)
MCH: 29.8 pg (ref 27.0–33.0)
MCHC: 34.1 g/dL (ref 32.0–36.0)
MCV: 87.6 fL (ref 80.0–100.0)
MPV: 9.6 fL (ref 7.5–12.5)
Monocytes Relative: 6.8 %
Neutro Abs: 2870 cells/uL (ref 1500–7800)
Neutrophils Relative %: 55.2 %
Platelets: 206 10*3/uL (ref 140–400)
RBC: 4.76 10*6/uL (ref 4.20–5.80)
RDW: 11.8 % (ref 11.0–15.0)
Total Lymphocyte: 34.7 %
WBC mixed population: 354 cells/uL (ref 200–950)
WBC: 5.2 10*3/uL (ref 3.8–10.8)

## 2017-09-06 LAB — TSH: TSH: 0.87 mIU/L (ref 0.40–4.50)

## 2017-09-06 LAB — VITAMIN B12: Vitamin B-12: 491 pg/mL (ref 200–1100)

## 2017-09-06 LAB — VITAMIN D 25 HYDROXY (VIT D DEFICIENCY, FRACTURES): Vit D, 25-Hydroxy: 41 ng/mL (ref 30–100)

## 2017-09-09 ENCOUNTER — Encounter: Payer: Self-pay | Admitting: Family Medicine

## 2017-10-03 ENCOUNTER — Encounter: Payer: Self-pay | Admitting: Family Medicine

## 2017-10-03 ENCOUNTER — Other Ambulatory Visit: Payer: Self-pay

## 2017-10-03 ENCOUNTER — Ambulatory Visit (INDEPENDENT_AMBULATORY_CARE_PROVIDER_SITE_OTHER): Payer: BLUE CROSS/BLUE SHIELD | Admitting: Family Medicine

## 2017-10-03 ENCOUNTER — Ambulatory Visit (HOSPITAL_COMMUNITY)
Admission: RE | Admit: 2017-10-03 | Discharge: 2017-10-03 | Disposition: A | Discharge status: Home / Self Care | Payer: BLUE CROSS/BLUE SHIELD | Source: Ambulatory Visit | Attending: Family Medicine | Admitting: Family Medicine

## 2017-10-03 VITALS — BP 116/60 | HR 71 | Temp 98.3°F | Resp 12 | Ht 66.0 in | Wt 222.1 lb

## 2017-10-03 DIAGNOSIS — R634 Abnormal weight loss: Secondary | ICD-10-CM | POA: Diagnosis not present

## 2017-10-03 DIAGNOSIS — R059 Cough, unspecified: Secondary | ICD-10-CM

## 2017-10-03 DIAGNOSIS — J40 Bronchitis, not specified as acute or chronic: Secondary | ICD-10-CM

## 2017-10-03 DIAGNOSIS — R05 Cough: Secondary | ICD-10-CM

## 2017-10-03 DIAGNOSIS — F339 Major depressive disorder, recurrent, unspecified: Secondary | ICD-10-CM

## 2017-10-03 DIAGNOSIS — R3989 Other symptoms and signs involving the genitourinary system: Secondary | ICD-10-CM | POA: Diagnosis not present

## 2017-10-03 MED ORDER — AMOXICILLIN-POT CLAVULANATE 875-125 MG PO TABS: 1.0000 | ORAL_TABLET | Freq: Two times a day (BID) | ORAL | 0 refills | Status: DC

## 2017-10-03 NOTE — Progress Notes (Signed)
Patient ID: Jonathan Grant, male    DOB: 04-Dec-1960, 57 y.o.   MRN: [id number]  Chief Complaint  Patient presents with  . Depression    follow up  . Cough    Allergies Patient has no known allergies.  Subjective:   Jonathan Grant is a 57 y.o. male who presents to [facility] today.  HPI Here for follow up visit for depression. Has been on the medication each day. Has been able to focus and not get as irritated at work. Can concentrate better. Can tell that the medication is helping him already.   Reports that would like to be evaluated for a cough. Reports that has been coughing for years and is around a lot of dust and debris at work. Does not smoke. Does welding and maintenance. Works at a [employer]. Report that has had a cough for years but it has increased over the past couple years. Then, over the past 1-2 months, has been coughing up yellow sputum. Reports that at times sounds like a "smokers cough". No hemoptysis. Does not wear a mask at work despite one being provided for him.  No history of asthma.  Has lost 11 pounds since he was seen here less than a month ago. Reports that he has not been snacking as much b/c has not had an appetite. Colonoscopy is UTD.   Yesterday, woke up with sinus pain and pressure in face. Cough has been worse over the past couple days. Does not feel SOB. Yesterday had nausea and felt like he was going to vomit but did not.  Reports that has lost weight over the past four months, has not been trying to loose weight.   Reports that he feels like over the past several weeks that his urine has been darker in color.  He reports he drinks a lot of water to try to stay hydrated but does work out in the heat.  He denies any urinary frequency, urgency, hesitancy, or burning with urination.  No hematuria.  Has done a nebs best this exposure at work.  Denies tobacco use.  Cough  This is a recurrent problem. The current episode started more  than 1 year ago. The problem has been gradually worsening. The problem occurs every few minutes. The cough is productive of sputum. Associated symptoms include nasal congestion, sweats and weight loss. Pertinent negatives include no chest pain, chills, ear congestion, ear pain, fever, headaches, myalgias, rash, rhinorrhea, sore throat, shortness of breath or wheezing. The symptoms are aggravated by dust and pollens. He has tried nothing for the symptoms. There is no history of asthma, bronchiectasis, bronchitis, COPD, emphysema, environmental allergies or pneumonia.    Past Medical History:  Diagnosis Date  . Depression   . Migraine     Past Surgical History:  Procedure Laterality Date  . HERNIA REPAIR      Family History  Problem Relation Age of Onset  . Dementia Mother   . Schizophrenia Mother      Social History   Socioeconomic History  . Marital status: Married    Spouse name: Not on file  . Number of children: 3  . Years of education: Not on file  . Highest education level: 12th grade  Occupational History  . Not on file  Social Needs  . Financial resource strain: Not on file  . Food insecurity:    Worry: Not on file    Inability: Not on file  . Transportation  needs:    Medical: Not on file    Non-medical: Not on file  Tobacco Use  . Smoking status: Never Smoker  . Smokeless tobacco: Never Used  Substance and Sexual Activity  . Alcohol use: No  . Drug use: No  . Sexual activity: Not on file  Lifestyle  . Physical activity:    Days per week: Not on file    Minutes per session: Not on file  . Stress: Not on file  Relationships  . Social connections:    Talks on phone: Not on file    Gets together: Not on file    Attends religious service: Not on file    Active member of club or organization: Not on file    Attends meetings of clubs or organizations: Not on file    Relationship status: Not on file  Other Topics Concern  . Not on file  Social History  Narrative   Lives in [place], [place].    Eats all food groups.    Pastors a church.   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        Review of Systems  Constitutional: Positive for weight loss. Negative for appetite change, chills, fever and unexpected weight change.  HENT: Negative for ear pain, rhinorrhea, sore throat, trouble swallowing and voice change.   Eyes: Negative for visual disturbance.  Respiratory: Negative for cough, chest tightness, shortness of breath and wheezing.   Cardiovascular: Negative for chest pain, palpitations and leg swelling.  Gastrointestinal: Negative for abdominal pain, diarrhea, nausea and vomiting.  Genitourinary: Negative for decreased urine volume, dysuria and frequency.  Musculoskeletal: Negative for back pain, myalgias and neck pain.  Skin: Negative for rash.  Allergic/Immunologic: Negative for environmental allergies.  Neurological: Negative for dizziness, tremors, syncope, facial asymmetry, weakness and headaches.  Hematological: Negative for adenopathy. Does not bruise/bleed easily.  Psychiatric/Behavioral: Negative for agitation, confusion, decreased concentration, dysphoric mood, self-injury, sleep disturbance and suicidal ideas. The patient is not nervous/anxious.    Current Outpatient Medications on File Prior to Visit  Medication Sig Dispense Refill  . citalopram (CELEXA) 20 MG tablet Take 1 tablet (20 mg total) by mouth daily. 90 tablet 0   No current facility-administered medications on file prior to visit.     Objective:   BP 116/60 (BP Location: Left Arm, Patient Position: Sitting, Cuff Size: Large)   Pulse 71   Temp 98.3 F (36.8 C) (Temporal)   Resp 12   Ht 5\' 6"  (1.676 m)   Wt 222 lb 1.3 oz (100.7 kg)   SpO2 99% Comment: room air  BMI 35.84 kg/m  Has lost 11 pounds since last visit on 09/05/2017.  Physical Exam  Constitutional: He is oriented to person, place, and time. He appears well-developed and  well-nourished.  HENT:  Head: Normocephalic and atraumatic.  Nose: Nose normal.  Mouth/Throat: Oropharynx is clear and moist.  Eyes: Pupils are equal, round, and reactive to light. Conjunctivae and EOM are normal. No scleral icterus.  Neck: Normal range of motion. Neck supple. No JVD present. No tracheal deviation present. No thyromegaly present.  Cardiovascular: Normal rate, regular rhythm and normal heart sounds.  Pulmonary/Chest: Effort normal and breath sounds normal. No stridor. No respiratory distress. He has no wheezes. He has no rales.  Abdominal: Soft. Bowel sounds are normal.  Musculoskeletal: He exhibits no edema.  Lymphadenopathy:    He has no cervical adenopathy.  Neurological: He is alert and oriented to person, place, and time.  No cranial nerve deficit.  Skin: Skin is warm, dry and intact. Capillary refill takes less than 2 seconds.  Psychiatric: He has a normal mood and affect. His behavior is normal. Judgment and thought content normal.  Vitals reviewed.  Depression screen [facility] 2/9 10/03/2017 09/05/2017  Decreased Interest 1 2  Down, Depressed, Hopeless 0 2  PHQ - 2 Score 1 4  Altered sleeping 1 2  Tired, decreased energy 1 2  Change in appetite 1 2  Feeling bad or failure about yourself  0 1  Trouble concentrating 1 2  Moving slowly or fidgety/restless 0 2  Suicidal thoughts 0 0  PHQ-9 Score 5 15  Difficult doing work/chores Somewhat difficult Somewhat difficult    Assessment and Plan  1. Cough -Discussed with patient that his cough most likely at this time is secondary to bronchitis.  Due to his history of a chronic cough with occupational exposures that put him at risk for lung disease I do believe that he needs pulmonary function testing.  We will go ahead and obtain chest x-ray at this time to rule out any other abnormality or pneumonia.  Will contact patient with chest x-ray results. - Ambulatory referral to Pulmonology - DG Chest 2 View; Future  2. Abnormal  urine color -Check urinalysis secondary to history of darker urine color. - Urinalysis, Routine w reflex microscopic  3. Weight loss -Patient does show weight loss on scales today.  However, he is dressed much lighter than usual.  At his last visit he did have on his work closed with heavy work boots and everything in his pockets.  We will plan to recheck this at his next visit.  We will also check labs to rule out any abnormalities with his blood counts or diabetes. - CBC with Differential/Platelet - Hemoglobin A1c - Basic metabolic panel  4. Bronchitis - amoxicillin-clavulanate (AUGMENTIN) 875-125 MG tablet; Take 1 tablet by mouth 2 (two) times daily.  Dispense: 20 tablet; Refill: 0 -Treat for bronchitis at this time.  This will also cover his sinus issues. Counseled regarding worrisome signs and symptoms of infection and if those occur contact medical help. Patient will follow-up with new PCP office in 1 month or sooner if needed.  He will also follow-up with pulmonology as recommended.  Will contact patient with his lab results.  He was told to call with any questions or concerns.  5. Depression Stable.  Suicide risks evaluated and documented in note if present or in the area below. Patient has protective factors of family and community support.  Patient reports that family believes is behaving rationally. Patient displays problem solving skills.   Patient specifically denies suicide ideation. Patient has access/information to healthcare contacts if situation or mood changes where patient is a risk to self or others or mood becomes unstable.   During the encounter, the patient had good eye contact and firm handshake regarding safety contract and agreement to seek help if mood worsens and not to harm self.   Patient understands the treatment plan and is in agreement. Agrees to keep follow up and call prior or return to clinic if needed.   No follow-ups on file. Aliene Beamsachel Chris Cripps,  MD 10/03/2017

## 2017-10-04 LAB — CBC WITH DIFFERENTIAL/PLATELET
Basophils Absolute: 19 cells/uL (ref 0–200)
Basophils Relative: 0.3 %
Eosinophils Absolute: 284 cells/uL (ref 15–500)
Eosinophils Relative: 4.5 %
HCT: 44.3 % (ref 38.5–50.0)
Hemoglobin: 15.5 g/dL (ref 13.2–17.1)
Lymphs Abs: 1997 cells/uL (ref 850–3900)
MCH: 30.8 pg (ref 27.0–33.0)
MCHC: 35 g/dL (ref 32.0–36.0)
MCV: 88.1 fL (ref 80.0–100.0)
MPV: 9.4 fL (ref 7.5–12.5)
Monocytes Relative: 5.4 %
Neutro Abs: 3660 cells/uL (ref 1500–7800)
Neutrophils Relative %: 58.1 %
Platelets: 229 10*3/uL (ref 140–400)
RBC: 5.03 10*6/uL (ref 4.20–5.80)
RDW: 12 % (ref 11.0–15.0)
Total Lymphocyte: 31.7 %
WBC mixed population: 340 cells/uL (ref 200–950)
WBC: 6.3 10*3/uL (ref 3.8–10.8)

## 2017-10-04 LAB — BASIC METABOLIC PANEL
BUN: 20 mg/dL (ref 7–25)
CO2: 28 mmol/L (ref 20–32)
Calcium: 9.8 mg/dL (ref 8.6–10.3)
Chloride: 105 mmol/L (ref 98–110)
Creat: 1.05 mg/dL (ref 0.70–1.33)
Glucose, Bld: 84 mg/dL (ref 65–99)
Potassium: 4.2 mmol/L (ref 3.5–5.3)
Sodium: 141 mmol/L (ref 135–146)

## 2017-10-04 LAB — HEMOGLOBIN A1C
Hgb A1c MFr Bld: 5.2 % of total Hgb (ref <5.7)
Mean Plasma Glucose: 103 (calc)
eAG (mmol/L): 5.7 (calc)

## 2017-10-04 LAB — URINALYSIS, ROUTINE W REFLEX MICROSCOPIC
Bilirubin Urine: NEGATIVE
Glucose, UA: NEGATIVE
Hgb urine dipstick: NEGATIVE
Ketones, ur: NEGATIVE
Leukocytes, UA: NEGATIVE
Nitrite: NEGATIVE
Protein, ur: NEGATIVE
Specific Gravity, Urine: 1.025 (ref 1.001–1.03)
pH: 7 (ref 5.0–8.0)

## 2017-10-08 ENCOUNTER — Encounter: Payer: Self-pay | Admitting: Family Medicine

## 2018-01-19 DIAGNOSIS — F331 Major depressive disorder, recurrent, moderate: Secondary | ICD-10-CM | POA: Diagnosis not present

## 2018-03-06 DIAGNOSIS — F331 Major depressive disorder, recurrent, moderate: Secondary | ICD-10-CM | POA: Diagnosis not present

## 2018-06-22 DIAGNOSIS — Z0000 Encounter for general adult medical examination without abnormal findings: Secondary | ICD-10-CM | POA: Diagnosis not present

## 2018-06-29 DIAGNOSIS — Z0000 Encounter for general adult medical examination without abnormal findings: Secondary | ICD-10-CM | POA: Diagnosis not present

## 2018-06-29 DIAGNOSIS — Z13220 Encounter for screening for lipoid disorders: Secondary | ICD-10-CM | POA: Diagnosis not present

## 2018-06-29 DIAGNOSIS — R740 Nonspecific elevation of levels of transaminase and lactic acid dehydrogenase [LDH]: Secondary | ICD-10-CM | POA: Diagnosis not present

## 2018-06-29 DIAGNOSIS — Z125 Encounter for screening for malignant neoplasm of prostate: Secondary | ICD-10-CM | POA: Diagnosis not present

## 2018-06-29 DIAGNOSIS — Z8619 Personal history of other infectious and parasitic diseases: Secondary | ICD-10-CM | POA: Diagnosis not present

## 2019-02-10 ENCOUNTER — Other Ambulatory Visit: Payer: Self-pay

## 2019-02-10 ENCOUNTER — Ambulatory Visit: Payer: BLUE CROSS/BLUE SHIELD | Attending: Internal Medicine

## 2019-02-10 DIAGNOSIS — Z20822 Contact with and (suspected) exposure to covid-19: Secondary | ICD-10-CM | POA: Insufficient documentation

## 2019-02-11 LAB — NOVEL CORONAVIRUS, NAA: SARS-CoV-2, NAA: NOT DETECTED

## 2019-03-29 ENCOUNTER — Emergency Department (HOSPITAL_COMMUNITY): Payer: Self-pay

## 2019-03-29 ENCOUNTER — Other Ambulatory Visit: Payer: Self-pay

## 2019-03-29 ENCOUNTER — Encounter (HOSPITAL_COMMUNITY): Payer: Self-pay | Admitting: Emergency Medicine

## 2019-03-29 ENCOUNTER — Ambulatory Visit
Admission: EM | Admit: 2019-03-29 | Discharge: 2019-03-29 | Disposition: A | Discharge status: Home / Self Care | Payer: Self-pay

## 2019-03-29 ENCOUNTER — Emergency Department (HOSPITAL_COMMUNITY)
Admission: EM | Admit: 2019-03-29 | Discharge: 2019-03-29 | Disposition: A | Discharge status: Home / Self Care | Payer: Self-pay | Attending: Emergency Medicine | Admitting: Emergency Medicine

## 2019-03-29 DIAGNOSIS — S0993XA Unspecified injury of face, initial encounter: Secondary | ICD-10-CM

## 2019-03-29 DIAGNOSIS — Z79899 Other long term (current) drug therapy: Secondary | ICD-10-CM | POA: Insufficient documentation

## 2019-03-29 DIAGNOSIS — W270XXA Contact with workbench tool, initial encounter: Secondary | ICD-10-CM | POA: Insufficient documentation

## 2019-03-29 DIAGNOSIS — Y990 Civilian activity done for income or pay: Secondary | ICD-10-CM | POA: Insufficient documentation

## 2019-03-29 DIAGNOSIS — Y939 Activity, unspecified: Secondary | ICD-10-CM | POA: Insufficient documentation

## 2019-03-29 DIAGNOSIS — Y9259 Other trade areas as the place of occurrence of the external cause: Secondary | ICD-10-CM | POA: Insufficient documentation

## 2019-03-29 DIAGNOSIS — S0990XA Unspecified injury of head, initial encounter: Secondary | ICD-10-CM | POA: Insufficient documentation

## 2019-03-29 DIAGNOSIS — S0181XA Laceration without foreign body of other part of head, initial encounter: Secondary | ICD-10-CM | POA: Insufficient documentation
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
## 2019-03-29 NOTE — Discharge Instructions (Addendum)
Take over the counter medications as needed for pain.  The dermabond will eventually fall off.

## 2019-03-29 NOTE — ED Provider Notes (Signed)
[facility] EMERGENCY DEPARTMENT Provider Note   CSN: [id number] Arrival date & time: 03/29/19  1012     History Chief Complaint  Patient presents with  . Head Injury    Jonathan Grant is a 59 y.o. male.  HPI   Patient presented to the emergency room for evaluation of a head and face injury.  Patient was at work wearing protective gear as well as his glasses when he was struck on the right side of his face and head with a sledgehammer.  Patient did have a brief loss of consciousness.  Patient did have some nausea earlier but that has resolved.  He went to an urgent care but they suggested he come to the ED.  Patient denies any neck pain.  No visual difficulties.  No other injuries.  Past Medical History:  Diagnosis Date  . Depression   . Migraine     Patient Active Problem List   Diagnosis Date Noted  . Clinical depression 09/05/2017  . HCV (hepatitis C virus) 09/05/2017  . Headache, migraine 09/05/2017  . Hypotestosteronism 09/05/2017  . Hemorrhoids, internal 10/31/2011    Past Surgical History:  Procedure Laterality Date  . HERNIA REPAIR         Family History  Problem Relation Age of Onset  . Dementia Mother   . Schizophrenia Mother     Social History   Tobacco Use  . Smoking status: Never Smoker  . Smokeless tobacco: Never Used  Substance Use Topics  . Alcohol use: No  . Drug use: No    Home Medications Prior to Admission medications   Medication Sig Start Date End Date Taking? Authorizing Provider  amoxicillin-clavulanate (AUGMENTIN) 875-125 MG tablet Take 1 tablet by mouth 2 (two) times daily. 10/03/17   Aliene Beams, MD  citalopram (CELEXA) 20 MG tablet Take 1 tablet (20 mg total) by mouth daily. 09/05/17   Aliene Beams, MD  venlafaxine XR (EFFEXOR-XR) 75 MG 24 hr capsule Take 75 mg by mouth daily. 02/11/19   [provider]    Allergies    Patient has no known allergies.  Review of Systems   Review of Systems  All other systems  reviewed and are negative.   Physical Exam Updated Vital Signs BP 130/74   Pulse 60   Temp 97.9 F (36.6 C) (Oral)   Resp 13   Ht 1.702 m (5\' 7" )   Wt 99.8 kg   SpO2 98%   BMI 34.46 kg/m   Physical Exam Vitals and nursing note reviewed.  Constitutional:      General: He is not in acute distress.    Appearance: He is well-developed.  HENT:     Head: Normocephalic.     Comments: Tenderness palpation with bruising in the right periorbital region, to curvilinear superficial lacerations above and below his right eye consistent with the impact of his eyeglasses    Right Ear: External ear normal.     Left Ear: External ear normal.  Eyes:     General: No scleral icterus.       Right eye: No discharge.        Left eye: No discharge.     Conjunctiva/sclera: Conjunctivae normal.  Neck:     Trachea: No tracheal deviation.  Cardiovascular:     Rate and Rhythm: Normal rate and regular rhythm.  Pulmonary:     Effort: Pulmonary effort is normal. No respiratory distress.     Breath sounds: Normal breath sounds. No stridor. No  wheezing or rales.  Abdominal:     General: Bowel sounds are normal. There is no distension.     Palpations: Abdomen is soft.     Tenderness: There is no abdominal tenderness. There is no guarding or rebound.  Musculoskeletal:        General: No tenderness.     Cervical back: Neck supple.     Comments: Cervical spine nontender  Skin:    General: Skin is warm and dry.     Findings: No rash.  Neurological:     Mental Status: He is alert.     Cranial Nerves: No cranial nerve deficit (no facial droop, extraocular movements intact, no slurred speech).     Sensory: No sensory deficit.     Motor: No abnormal muscle tone or seizure activity.     Coordination: Coordination normal.     ED Results / Procedures / Treatments   Labs (all labs ordered are listed, but only abnormal results are displayed) Labs Reviewed - No data to  display  EKG None  Radiology CT Head Wo Contrast  Result Date: 03/29/2019 CLINICAL DATA:  Hit on the right side of the head with a hammer 1 hour ago. EXAM: CT HEAD WITHOUT CONTRAST CT MAXILLOFACIAL WITHOUT CONTRAST TECHNIQUE: Multidetector CT imaging of the head and maxillofacial structures were performed using the standard protocol without intravenous contrast. Multiplanar CT image reconstructions of the maxillofacial structures were also generated. COMPARISON:  Head CT, 06/03/2015 FINDINGS: CT HEAD FINDINGS Brain: No evidence of acute infarction, hemorrhage, hydrocephalus, extra-axial collection or mass lesion/mass effect. Vascular: No hyperdense vessel or unexpected calcification. Skull: Normal. Negative for fracture or focal lesion. Other: None CT MAXILLOFACIAL FINDINGS Osseous: No fracture or mandibular dislocation. No destructive process. Orbits: Negative. No traumatic or inflammatory finding. Sinuses: Minor mucosal thickening along the floors of the maxillary sinuses and scattered along the ethmoid air cells and inferior left frontal sinus. Clear mastoid air cells and middle ear cavities. Soft tissues: Negative. IMPRESSION: HEAD CT 1. Normal. MAXILLOFACIAL CT 1. No fracture or significant abnormality. Electronically Signed   By: Lajean Manes M.D.   On: 03/29/2019 11:45   CT Maxillofacial Wo Contrast  Result Date: 03/29/2019 CLINICAL DATA:  Hit on the right side of the head with a hammer 1 hour ago. EXAM: CT HEAD WITHOUT CONTRAST CT MAXILLOFACIAL WITHOUT CONTRAST TECHNIQUE: Multidetector CT imaging of the head and maxillofacial structures were performed using the standard protocol without intravenous contrast. Multiplanar CT image reconstructions of the maxillofacial structures were also generated. COMPARISON:  Head CT, 06/03/2015 FINDINGS: CT HEAD FINDINGS Brain: No evidence of acute infarction, hemorrhage, hydrocephalus, extra-axial collection or mass lesion/mass effect. Vascular: No hyperdense  vessel or unexpected calcification. Skull: Normal. Negative for fracture or focal lesion. Other: None CT MAXILLOFACIAL FINDINGS Osseous: No fracture or mandibular dislocation. No destructive process. Orbits: Negative. No traumatic or inflammatory finding. Sinuses: Minor mucosal thickening along the floors of the maxillary sinuses and scattered along the ethmoid air cells and inferior left frontal sinus. Clear mastoid air cells and middle ear cavities. Soft tissues: Negative. IMPRESSION: HEAD CT 1. Normal. MAXILLOFACIAL CT 1. No fracture or significant abnormality. Electronically Signed   By: Lajean Manes M.D.   On: 03/29/2019 11:45    Procedures .Marland KitchenLaceration Repair  Date/Time: 03/29/2019 12:40 PM Performed by: Dorie Rank, MD Authorized by: Dorie Rank, MD   Consent:    Consent obtained:  Verbal   Consent given by:  Patient   Risks discussed:  Infection, need for additional  repair, pain, poor cosmetic result and poor wound healing   Alternatives discussed:  No treatment and delayed treatment Universal protocol:    Procedure explained and questions answered to patient or proxy's satisfaction: yes     Relevant documents present and verified: yes     Test results available and properly labeled: yes     Imaging studies available: yes     Required blood products, implants, devices, and special equipment available: yes     Site/side marked: yes     Immediately prior to procedure, a time out was called: yes     Patient identity confirmed:  Verbally with patient Anesthesia (see MAR for exact dosages):    Anesthesia method:  None Laceration details:    Location: right face.   Length (cm):  3 Repair type:    Repair type:  Simple Exploration:    Wound extent: no areolar tissue violation noted, no fascia violation noted, no foreign bodies/material noted, no muscle damage noted, no nerve damage noted, no tendon damage noted, no underlying fracture noted and no vascular damage noted     Contaminated:  no   Treatment:    Area cleansed with:  Shur-Clens   Amount of cleaning:  Standard Skin repair:    Repair method:  Tissue adhesive   (including critical care time)  Medications Ordered in ED Medications - No data to display  ED Course  I have reviewed the triage vital signs and the nursing notes.  Pertinent labs & imaging results that were available during my care of the patient were reviewed by me and considered in my medical decision making (see chart for details).  Clinical Course as of Mar 28 1236  Mon Mar 29, 2019  1149 CT scans without acute injury   [JK]    Clinical Course User Index [JK] Linwood Dibbles, MD   MDM Rules/Calculators/A&P                      CT without acute finding.  Laceration repaired with dermabond.  Stable for discharge. Final Clinical Impression(s) / ED Diagnoses Final diagnoses:  Injury of head, initial encounter  Facial injury, initial encounter    Rx / DC Orders ED Discharge Orders    None       Linwood Dibbles, MD 03/29/19 1240

## 2019-03-29 NOTE — ED Triage Notes (Signed)
Pt states he was accidentally hit in the right side of face by hammer at work and pt lost consciousness, pt is alert and oriented but suggested would need to be seen in ED due to loss of consciousness. Pt being driven by coworker

## 2019-06-11 DIAGNOSIS — F331 Major depressive disorder, recurrent, moderate: Secondary | ICD-10-CM | POA: Diagnosis not present

## 2019-08-23 DIAGNOSIS — E291 Testicular hypofunction: Secondary | ICD-10-CM | POA: Diagnosis not present

## 2019-08-23 DIAGNOSIS — Z6835 Body mass index (BMI) 35.0-35.9, adult: Secondary | ICD-10-CM | POA: Diagnosis not present

## 2019-08-23 DIAGNOSIS — R5382 Chronic fatigue, unspecified: Secondary | ICD-10-CM | POA: Diagnosis not present

## 2019-08-23 DIAGNOSIS — E782 Mixed hyperlipidemia: Secondary | ICD-10-CM | POA: Diagnosis not present

## 2019-08-25 DIAGNOSIS — Z1339 Encounter for screening examination for other mental health and behavioral disorders: Secondary | ICD-10-CM | POA: Diagnosis not present

## 2019-08-25 DIAGNOSIS — F5101 Primary insomnia: Secondary | ICD-10-CM | POA: Diagnosis not present

## 2019-08-25 DIAGNOSIS — E291 Testicular hypofunction: Secondary | ICD-10-CM | POA: Diagnosis not present

## 2019-08-25 DIAGNOSIS — Z1331 Encounter for screening for depression: Secondary | ICD-10-CM | POA: Diagnosis not present

## 2019-08-25 DIAGNOSIS — M2559 Pain in other specified joint: Secondary | ICD-10-CM | POA: Diagnosis not present

## 2019-08-25 DIAGNOSIS — R5382 Chronic fatigue, unspecified: Secondary | ICD-10-CM | POA: Diagnosis not present

## 2019-09-28 ENCOUNTER — Ambulatory Visit: Payer: Self-pay

## 2020-01-18 DIAGNOSIS — Z23 Encounter for immunization: Secondary | ICD-10-CM | POA: Diagnosis not present

## 2020-01-18 DIAGNOSIS — F331 Major depressive disorder, recurrent, moderate: Secondary | ICD-10-CM | POA: Diagnosis not present

## 2020-02-15 DIAGNOSIS — F331 Major depressive disorder, recurrent, moderate: Secondary | ICD-10-CM | POA: Diagnosis not present

## 2020-02-15 DIAGNOSIS — N529 Male erectile dysfunction, unspecified: Secondary | ICD-10-CM | POA: Diagnosis not present

## 2020-03-13 ENCOUNTER — Other Ambulatory Visit: Payer: Self-pay

## 2020-03-13 ENCOUNTER — Encounter: Payer: Self-pay | Admitting: Urology

## 2020-03-13 ENCOUNTER — Ambulatory Visit (INDEPENDENT_AMBULATORY_CARE_PROVIDER_SITE_OTHER): Payer: Self-pay | Admitting: Urology

## 2020-03-13 VITALS — BP 135/82 | HR 76

## 2020-03-13 DIAGNOSIS — R3915 Urgency of urination: Secondary | ICD-10-CM

## 2020-03-13 DIAGNOSIS — M545 Low back pain, unspecified: Secondary | ICD-10-CM

## 2020-03-13 DIAGNOSIS — G8929 Other chronic pain: Secondary | ICD-10-CM

## 2020-03-13 DIAGNOSIS — N5201 Erectile dysfunction due to arterial insufficiency: Secondary | ICD-10-CM

## 2020-03-13 LAB — URINALYSIS, ROUTINE W REFLEX MICROSCOPIC
Bilirubin, UA: NEGATIVE
Glucose, UA: NEGATIVE
Ketones, UA: NEGATIVE
Leukocytes,UA: NEGATIVE
Nitrite, UA: NEGATIVE
Protein,UA: NEGATIVE
RBC, UA: NEGATIVE
Specific Gravity, UA: <1.005 — ABNORMAL LOW (ref 1.005–1.030)
Urobilinogen, Ur: 0.2 mg/dL (ref 0.2–1.0)
pH, UA: 6.5 (ref 5.0–7.5)

## 2020-03-13 MED ORDER — SILDENAFIL CITRATE 20 MG PO TABS: 20.0000 mg | ORAL_TABLET | Freq: Every day | ORAL | 11 refills | Status: AC

## 2020-03-13 NOTE — Patient Instructions (Signed)
Sildenafil tablets (Erectile Dysfunction) What is this medicine? SILDENAFIL (sil DEN a fil) is used to treat erection problems in men. This medicine may be used for other purposes; ask your health care provider or pharmacist if you have questions. COMMON BRAND NAME(S): Viagra What should I tell my health care provider before I take this medicine? They need to know if you have any of these conditions:  bleeding disorders  eye or vision problems, including a rare inherited eye disease called retinitis pigmentosa  anatomical deformation of the penis, Peyronie's disease, or history of priapism (painful and prolonged erection)  heart disease, angina, a history of heart attack, irregular heart beats, or other heart problems  high or low blood pressure  history of blood diseases, like sickle cell anemia or leukemia  history of stomach bleeding  kidney disease  liver disease  stroke  an unusual or allergic reaction to sildenafil, other medicines, foods, dyes, or preservatives  pregnant or trying to get pregnant  breast-feeding How should I use this medicine? Take this medicine by mouth with a glass of water. Follow the directions on the prescription label. The dose is usually taken 1 hour before sexual activity. You should not take the dose more than once per day. Do not take your medicine more often than directed. Talk to your pediatrician regarding the use of this medicine in children. This medicine is not used in children for this condition. Overdosage: If you think you have taken too much of this medicine contact a poison control center or emergency room at once. NOTE: This medicine is only for you. Do not share this medicine with others. What if I miss a dose? This does not apply. Do not take double or extra doses. What may interact with this medicine? Do not take this medicine with any of the following medications:  cisapride  nitrates like amyl nitrite, isosorbide  dinitrate, isosorbide mononitrate, nitroglycerin  riociguat This medicine may also interact with the following medications:  antiviral medicines for HIV or AIDS  bosentan  certain medicines for benign prostatic hyperplasia (BPH)  certain medicines for blood pressure  certain medicines for fungal infections like ketoconazole and itraconazole  cimetidine  erythromycin  rifampin This list may not describe all possible interactions. Give your health care provider a list of all the medicines, herbs, non-prescription drugs, or dietary supplements you use. Also tell them if you smoke, drink alcohol, or use illegal drugs. Some items may interact with your medicine. What should I watch for while using this medicine? If you notice any changes in your vision while taking this drug, call your doctor or health care professional as soon as possible. Stop using this medicine and call your health care provider right away if you have a loss of sight in one or both eyes. Contact your doctor or health care professional right away if you have an erection that lasts longer than 4 hours or if it becomes painful. This may be a sign of a serious problem and must be treated right away to prevent permanent damage. If you experience symptoms of nausea, dizziness, chest pain or arm pain upon initiation of sexual activity after taking this medicine, you should refrain from further activity and call your doctor or health care professional as soon as possible. Do not drink alcohol to excess (examples, 5 glasses of wine or 5 shots of whiskey) when taking this medicine. When taken in excess, alcohol can increase your chances of getting a headache or getting dizzy, increasing   your heart rate or lowering your blood pressure. Using this medicine does not protect you or your partner against HIV infection (the virus that causes AIDS) or other sexually transmitted diseases. What side effects may I notice from receiving this  medicine? Side effects that you should report to your doctor or health care professional as soon as possible:  allergic reactions like skin rash, itching or hives, swelling of the face, lips, or tongue  breathing problems  changes in hearing  changes in vision  chest pain  fast, irregular heartbeat  prolonged or painful erection  seizures Side effects that usually do not require medical attention (report to your doctor or health care professional if they continue or are bothersome):  back pain  dizziness  flushing  headache  indigestion  muscle aches  nausea  stuffy or runny nose This list may not describe all possible side effects. Call your doctor for medical advice about side effects. You may report side effects to FDA at 1-800-FDA-1088. Where should I keep my medicine? Keep out of reach of children. Store at room temperature between 15 and 30 degrees C (59 and 86 degrees F). Throw away any unused medicine after the expiration date. NOTE: This sheet is a summary. It may not cover all possible information. If you have questions about this medicine, talk to your doctor, pharmacist, or health care provider.  2021 Elsevier/Gold Standard (2014-12-21 12:00:25)  

## 2020-03-13 NOTE — Progress Notes (Signed)
[facility]   03/13/2020 11:23 AM   Jonathan Grant 1960/08/21 [id number]  Referring provider: Aliene Beams, MD [address] [place],  [place] [address]  No chief complaint on file.   HPI: He presents with ED. Going on since June 2021. His libido seems OK. Thinks his T might be low. They have children x 3. Has not tried anything for it. Labs from PCP with PSA only from 08/20 at 0.4.   Mentioned right back pain. No h/o stones. UA today is clear. No hematuria. Occasional urinary urgency.   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   PMH: Past Medical History:  Diagnosis Date  . Depression   . Migraine     Surgical History: Past Surgical History:  Procedure Laterality Date  . HERNIA REPAIR      Home Medications:  Allergies as of 03/13/2020   No Known Allergies     Medication List       Accurate as of March 13, 2020 11:23 AM. If you have any questions, ask your nurse or doctor.        STOP taking these medications   amoxicillin-clavulanate 875-125 MG tablet Commonly known as: AUGMENTIN Stopped by: Jerilee Field, MD   citalopram 20 MG tablet Commonly known as: CELEXA Stopped by: Jerilee Field, MD     TAKE these medications   Fish Oil 875 MG Caps 1 capsule   Magnesium 400 MG Caps 1 capsule   venlafaxine XR 75 MG 24 hr capsule Commonly known as: EFFEXOR-XR Take 75 mg by mouth daily.   VITAMIN B 12 PO 1 tablet   Vitamin D 125 MCG (5000 UT) Caps 1 tablet       Allergies: No Known Allergies  Family History: Family History  Problem Relation Age of Onset  . Dementia Mother   . Schizophrenia Mother     Social History:  reports that he has never smoked. He has never used smokeless tobacco. He reports that he does not drink alcohol and does not use drugs.   Physical Exam: BP 135/82   Pulse 76   Constitutional:  Alert and oriented, No acute distress. HEENT: [place] AT, moist mucus membranes.  Trachea midline, no  masses. Cardiovascular: No clubbing, cyanosis, or edema. Respiratory: Normal respiratory effort, no increased work of breathing.  GI: Abdomen is soft, nontender, nondistended, no abdominal masses GU: No CVA tenderness Lymph: No cervical or inguinal lymphadenopathy. Skin: No rashes, bruises or suspicious lesions. Neurologic: Grossly intact, no focal deficits, moving all 4 extremities. Psychiatric: Normal mood and affect. GU: Penis circumcised, normal foreskin, testicles descended bilaterally and palpably normal, bilateral epididymis palpably normal, scrotum normal DRE: Prostate 30 g, smooth without hard area or nodule   Laboratory Data: Lab Results  Component Value Date   WBC 6.3 10/03/2017   HGB 15.5 10/03/2017   HCT 44.3 10/03/2017   MCV 88.1 10/03/2017   PLT 229 10/03/2017    Lab Results  Component Value Date   CREATININE 1.05 10/03/2017    No results found for: PSA  No results found for: TESTOSTERONE  Lab Results  Component Value Date   HGBA1C 5.2 10/03/2017    Urinalysis    Component Value Date/Time   COLORURINE YELLOW 10/03/2017 1006   APPEARANCEUR CLEAR 10/03/2017 1006   LABSPEC 1.025 10/03/2017 1006   PHURINE 7.0 10/03/2017 1006   GLUCOSEU NEGATIVE 10/03/2017 1006   HGBUR NEGATIVE 10/03/2017 1006   BILIRUBINUR NEGATIVE 06/22/2008 1411   KETONESUR NEGATIVE 10/03/2017  1006   PROTEINUR NEGATIVE 10/03/2017 1006   UROBILINOGEN 0.2 06/22/2008 1411   NITRITE NEGATIVE 10/03/2017 1006   LEUKOCYTESUR NEGATIVE 10/03/2017 1006    No results found for: LABMICR, WBCUA, RBCUA, LABEPIT, MUCUS, BACTERIA  Pertinent Imaging: n/a No results found for this or any previous visit.  No results found for this or any previous visit.  No results found for this or any previous visit.  No results found for this or any previous visit.  No results found for this or any previous visit.  No results found for this or any previous visit.  No results found for this or any  previous visit.  No results found for this or any previous visit.  17 pages received and reviewed from PCP p pt left.   Assessment & Plan:    Urinary urgency - DRE was nl. We got labs from PCP p he was gone. Will send PSA next time.   Back pain - UA clear. See PCP for eval.    ED - disc nature r/b/a to pde5i and I sent a rx for sildenafil. Consider T level.   No follow-ups on file.  Jerilee Field, MD

## 2020-03-13 NOTE — Progress Notes (Signed)

## 2020-06-05 ENCOUNTER — Ambulatory Visit (INDEPENDENT_AMBULATORY_CARE_PROVIDER_SITE_OTHER): Payer: Self-pay | Admitting: Urology

## 2020-06-05 ENCOUNTER — Encounter: Payer: Self-pay | Admitting: Urology

## 2020-06-05 ENCOUNTER — Other Ambulatory Visit: Payer: Self-pay

## 2020-06-05 VITALS — BP 129/82 | HR 91 | Temp 98.4°F | Ht 67.0 in | Wt 221.0 lb

## 2020-06-05 DIAGNOSIS — N5201 Erectile dysfunction due to arterial insufficiency: Secondary | ICD-10-CM

## 2020-06-05 DIAGNOSIS — R3915 Urgency of urination: Secondary | ICD-10-CM

## 2020-06-05 LAB — URINALYSIS, ROUTINE W REFLEX MICROSCOPIC
Bilirubin, UA: NEGATIVE
Glucose, UA: NEGATIVE
Ketones, UA: NEGATIVE
Leukocytes,UA: NEGATIVE
Nitrite, UA: NEGATIVE
Protein,UA: NEGATIVE
Specific Gravity, UA: 1.025 (ref 1.005–1.030)
Urobilinogen, Ur: 0.2 mg/dL (ref 0.2–1.0)
pH, UA: 6 (ref 5.0–7.5)

## 2020-06-05 LAB — MICROSCOPIC EXAMINATION
Bacteria, UA: NONE SEEN
Epithelial Cells (non renal): NONE SEEN /hpf (ref 0–10)
RBC, Urine: NONE SEEN /hpf (ref 0–2)
Renal Epithel, UA: NONE SEEN /hpf
WBC, UA: NONE SEEN /hpf (ref 0–5)

## 2020-06-05 NOTE — Progress Notes (Signed)
[facility]   06/05/2020 9:05 AM   Jonathan Grant 1960-06-26 [id number]  Referring provider: Aliene Beams, MD [address] [place],  [place] [address]  No chief complaint on file.   HPI:  F/u -   1) ED - Since June 2021. Tried sildenafil 02/22. It worked well, but he had some flushing with 100 mg although he was taking a 20 mg daily. His libido seems OK. Thinks his T might be low. They have children x 3. Labs from PCP with PSA only from 08/20 at 0.4. His has some fatigue and weight gain.   2) LUTS - occasional urgency. Mentioned right back pain. No h/o stones. UA clear. No gross hematuria.   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   PMH: Past Medical History:  Diagnosis Date  . Depression   . Migraine     Surgical History: Past Surgical History:  Procedure Laterality Date  . HERNIA REPAIR      Home Medications:  Allergies as of 06/05/2020   No Known Allergies     Medication List       Accurate as of Jun 05, 2020  9:05 AM. If you have any questions, ask your nurse or doctor.        Fish Oil 875 MG Caps 1 capsule   Magnesium 400 MG Caps 1 capsule   sildenafil 20 MG tablet Commonly known as: REVATIO Take 1 tablet (20 mg total) by mouth daily. Take 1-5 tablets as needed   venlafaxine XR 75 MG 24 hr capsule Commonly known as: EFFEXOR-XR Take 75 mg by mouth daily.   VITAMIN B 12 PO 1 tablet   Vitamin D 125 MCG (5000 UT) Caps 1 tablet       Allergies: No Known Allergies  Family History: Family History  Problem Relation Age of Onset  . Dementia Mother   . Schizophrenia Mother     Social History:  reports that he has never smoked. He has never used smokeless tobacco. He reports that he does not drink alcohol and does not use drugs.   Physical Exam: There were no vitals taken for this visit.  Constitutional:  Alert and oriented, No acute distress. HEENT: [place] AT, moist mucus membranes.  Trachea midline, no  masses. Cardiovascular: No clubbing, cyanosis, or edema. Respiratory: Normal respiratory effort, no increased work of breathing. GU: No CVA tenderness Lymph: No cervical or inguinal lymphadenopathy. Skin: No rashes, bruises or suspicious lesions. Neurologic: Grossly intact, no focal deficits, moving all 4 extremities. Psychiatric: Normal mood and affect.  Laboratory Data: Lab Results  Component Value Date   WBC 6.3 10/03/2017   HGB 15.5 10/03/2017   HCT 44.3 10/03/2017   MCV 88.1 10/03/2017   PLT 229 10/03/2017    Lab Results  Component Value Date   CREATININE 1.05 10/03/2017    No results found for: PSA  No results found for: TESTOSTERONE  Lab Results  Component Value Date   HGBA1C 5.2 10/03/2017    Urinalysis    Component Value Date/Time   COLORURINE YELLOW 10/03/2017 1006   APPEARANCEUR Clear 03/13/2020 1141   LABSPEC 1.025 10/03/2017 1006   PHURINE 7.0 10/03/2017 1006   GLUCOSEU Negative 03/13/2020 1141   HGBUR NEGATIVE 10/03/2017 1006   BILIRUBINUR Negative 03/13/2020 1141   KETONESUR NEGATIVE 10/03/2017 1006   PROTEINUR Negative 03/13/2020 1141   PROTEINUR NEGATIVE 10/03/2017 1006   UROBILINOGEN 0.2 06/22/2008 1411   NITRITE Negative 03/13/2020 1141   NITRITE NEGATIVE 10/03/2017 1006  LEUKOCYTESUR Negative 03/13/2020 1141    Lab Results  Component Value Date   LABMICR Comment 03/13/2020      Assessment & Plan:    1. Urinary urgency Stable    2. ED - I sent a T and a PSA. Disc nature r/b of clomid and T replacement - DVT, MI, among others.   No follow-ups on file.  Jerilee Field, MD

## 2020-06-05 NOTE — Progress Notes (Signed)
Urological Symptom Review  Patient is experiencing the following symptoms: Erection problems (male only)   Review of Systems  Gastrointestinal (upper)  : Negative for upper GI symptoms  Gastrointestinal (lower) : Negative for lower GI symptoms  Constitutional : Negative for symptoms  Skin: Itching  Eyes: Negative for eye symptoms  Ear/Nose/Throat : Negative for Ear/Nose/Throat symptoms  Hematologic/Lymphatic: Negative for Hematologic/Lymphatic symptoms  Cardiovascular : Negative for cardiovascular symptoms  Respiratory : Negative for respiratory symptoms  Endocrine: Negative for endocrine symptoms  Musculoskeletal: Negative for musculoskeletal symptoms  Neurological: Negative for neurological symptoms  Psychologic: Negative for psychiatric symptoms

## 2020-06-06 LAB — TESTOSTERONE: Testosterone: 397 ng/dL (ref 264–916)

## 2020-06-06 LAB — PSA: Prostate Specific Ag, Serum: 0.7 ng/mL (ref 0.0–4.0)

## 2020-06-08 ENCOUNTER — Telehealth: Payer: Self-pay

## 2020-06-08 NOTE — Telephone Encounter (Signed)
-----   Message from Jerilee Field, MD sent at 06/07/2020  8:26 AM EDT ----- Avelyn Touch - let Nole know his T level looks good. It's 397 which is normal.  ----- Message ----- From: Gustavus Messing, LPN Sent: [id number]/94/5038   8:21 AM EDT To: Jerilee Field, MD  Please review

## 2020-06-08 NOTE — Telephone Encounter (Signed)
Patient called and made aware.

## 2020-07-31 HISTORY — PX: CARDIOVASCULAR STRESS TEST: SHX262

## 2020-09-04 ENCOUNTER — Ambulatory Visit: Payer: Self-pay | Admitting: Urology

## 2020-10-19 DIAGNOSIS — F331 Major depressive disorder, recurrent, moderate: Secondary | ICD-10-CM | POA: Diagnosis not present

## 2021-06-11 DIAGNOSIS — N529 Male erectile dysfunction, unspecified: Secondary | ICD-10-CM | POA: Diagnosis not present

## 2021-06-11 DIAGNOSIS — R635 Abnormal weight gain: Secondary | ICD-10-CM | POA: Diagnosis not present

## 2021-06-11 DIAGNOSIS — E669 Obesity, unspecified: Secondary | ICD-10-CM | POA: Diagnosis not present

## 2021-06-11 DIAGNOSIS — R748 Abnormal levels of other serum enzymes: Secondary | ICD-10-CM | POA: Diagnosis not present

## 2021-06-11 DIAGNOSIS — Z79899 Other long term (current) drug therapy: Secondary | ICD-10-CM | POA: Diagnosis not present

## 2021-06-11 DIAGNOSIS — F331 Major depressive disorder, recurrent, moderate: Secondary | ICD-10-CM | POA: Diagnosis not present

## 2021-07-12 ENCOUNTER — Other Ambulatory Visit: Payer: Self-pay | Admitting: Neurological Surgery

## 2021-07-13 ENCOUNTER — Encounter (HOSPITAL_COMMUNITY): Payer: Self-pay | Admitting: Neurological Surgery

## 2021-07-14 ENCOUNTER — Emergency Department (HOSPITAL_COMMUNITY)
Admission: EM | Admit: 2021-07-14 | Discharge: 2021-07-15 | Discharge status: Against Medical Advice | Payer: Medicare PPO | Attending: Emergency Medicine | Admitting: Emergency Medicine

## 2021-07-14 ENCOUNTER — Encounter (HOSPITAL_COMMUNITY): Payer: Self-pay

## 2021-07-14 ENCOUNTER — Other Ambulatory Visit: Payer: Self-pay

## 2021-07-14 DIAGNOSIS — R350 Frequency of micturition: Secondary | ICD-10-CM | POA: Insufficient documentation

## 2021-07-14 DIAGNOSIS — M25569 Pain in unspecified knee: Secondary | ICD-10-CM | POA: Diagnosis not present

## 2021-07-14 DIAGNOSIS — R739 Hyperglycemia, unspecified: Secondary | ICD-10-CM | POA: Insufficient documentation

## 2021-07-14 DIAGNOSIS — R42 Dizziness and giddiness: Secondary | ICD-10-CM | POA: Diagnosis present

## 2021-07-14 DIAGNOSIS — Z5321 Procedure and treatment not carried out due to patient leaving prior to being seen by health care provider: Secondary | ICD-10-CM | POA: Diagnosis not present

## 2021-07-14 LAB — CBC
HCT: 51 % (ref 39.0–52.0)
Hemoglobin: 15.9 g/dL (ref 13.0–17.0)
MCH: 25.9 pg — ABNORMAL LOW (ref 26.0–34.0)
MCHC: 31.2 g/dL (ref 30.0–36.0)
MCV: 83.2 fL (ref 80.0–100.0)
Platelets: 237 10*3/uL (ref 150–400)
RBC: 6.13 MIL/uL — ABNORMAL HIGH (ref 4.22–5.81)
RDW: 12.5 % (ref 11.5–15.5)
WBC: 7.7 10*3/uL (ref 4.0–10.5)
nRBC: 0 % (ref 0.0–0.2)

## 2021-07-14 LAB — BASIC METABOLIC PANEL
Anion gap: 16 — ABNORMAL HIGH (ref 5–15)
BUN: 16 mg/dL (ref 6–20)
CO2: 22 mmol/L (ref 22–32)
Calcium: 10.1 mg/dL (ref 8.9–10.3)
Chloride: 95 mmol/L — ABNORMAL LOW (ref 98–111)
Creatinine, Ser: 1.45 mg/dL — ABNORMAL HIGH (ref 0.61–1.24)
GFR, Estimated: 56 mL/min — ABNORMAL LOW (ref >60)
Glucose, Bld: 374 mg/dL — ABNORMAL HIGH (ref 70–99)
Potassium: 4.6 mmol/L (ref 3.5–5.1)
Sodium: 133 mmol/L — ABNORMAL LOW (ref 135–145)

## 2021-07-14 LAB — CBG MONITORING, ED: Glucose-Capillary: 392 mg/dL — ABNORMAL HIGH (ref 70–99)

## 2021-07-14 MED ORDER — SODIUM CHLORIDE 0.9 % IV BOLUS: 1000.0000 mL | Freq: Once | INTRAVENOUS | Status: DC

## 2021-07-14 NOTE — ED Triage Notes (Signed)
Went to [place] yesterday for knee pain and given steroids.   CBG has been climbing all day in the mid 400's. Takes Metformin and Jardiance.   C/o dizziness, blurred vision and frequent urination.

## 2021-07-16 ENCOUNTER — Inpatient Hospital Stay (HOSPITAL_COMMUNITY): Payer: No Typology Code available for payment source | Admitting: Vascular Surgery

## 2021-07-16 ENCOUNTER — Inpatient Hospital Stay (HOSPITAL_COMMUNITY)
Admission: RE | Disposition: A | Discharge status: Home / Self Care | Payer: Self-pay | Source: Home / Self Care | Attending: Neurological Surgery

## 2021-07-16 ENCOUNTER — Other Ambulatory Visit: Payer: Self-pay

## 2021-07-16 ENCOUNTER — Inpatient Hospital Stay (HOSPITAL_COMMUNITY): Payer: No Typology Code available for payment source

## 2021-07-16 ENCOUNTER — Inpatient Hospital Stay (HOSPITAL_COMMUNITY)
Admission: RE | Admit: 2021-07-16 | Discharge: 2021-07-17 | DRG: 473 | Disposition: A | Discharge status: Home / Self Care | Payer: No Typology Code available for payment source | Attending: Neurological Surgery | Admitting: Neurological Surgery

## 2021-07-16 ENCOUNTER — Encounter (HOSPITAL_COMMUNITY): Payer: Self-pay | Admitting: Neurological Surgery

## 2021-07-16 DIAGNOSIS — E119 Type 2 diabetes mellitus without complications: Secondary | ICD-10-CM | POA: Diagnosis present

## 2021-07-16 DIAGNOSIS — I509 Heart failure, unspecified: Secondary | ICD-10-CM | POA: Diagnosis not present

## 2021-07-16 DIAGNOSIS — M50121 Cervical disc disorder at C4-C5 level with radiculopathy: Secondary | ICD-10-CM | POA: Diagnosis present

## 2021-07-16 DIAGNOSIS — M50123 Cervical disc disorder at C6-C7 level with radiculopathy: Secondary | ICD-10-CM | POA: Diagnosis present

## 2021-07-16 DIAGNOSIS — E785 Hyperlipidemia, unspecified: Secondary | ICD-10-CM | POA: Diagnosis present

## 2021-07-16 DIAGNOSIS — M4802 Spinal stenosis, cervical region: Secondary | ICD-10-CM | POA: Diagnosis present

## 2021-07-16 DIAGNOSIS — Z7984 Long term (current) use of oral hypoglycemic drugs: Secondary | ICD-10-CM

## 2021-07-16 DIAGNOSIS — Z888 Allergy status to other drugs, medicaments and biological substances status: Secondary | ICD-10-CM | POA: Diagnosis not present

## 2021-07-16 DIAGNOSIS — M50122 Cervical disc disorder at C5-C6 level with radiculopathy: Secondary | ICD-10-CM | POA: Diagnosis present

## 2021-07-16 DIAGNOSIS — I10 Essential (primary) hypertension: Secondary | ICD-10-CM | POA: Diagnosis present

## 2021-07-16 DIAGNOSIS — M5412 Radiculopathy, cervical region: Secondary | ICD-10-CM

## 2021-07-16 DIAGNOSIS — Z79899 Other long term (current) drug therapy: Secondary | ICD-10-CM

## 2021-07-16 DIAGNOSIS — Z981 Arthrodesis status: Principal | ICD-10-CM

## 2021-07-16 DIAGNOSIS — I110 Hypertensive heart disease with heart failure: Secondary | ICD-10-CM | POA: Diagnosis not present

## 2021-07-16 DIAGNOSIS — J45909 Unspecified asthma, uncomplicated: Secondary | ICD-10-CM | POA: Diagnosis present

## 2021-07-16 DIAGNOSIS — K219 Gastro-esophageal reflux disease without esophagitis: Secondary | ICD-10-CM | POA: Diagnosis present

## 2021-07-16 HISTORY — PX: POSTERIOR CERVICAL FUSION/FORAMINOTOMY: SHX5038

## 2021-07-16 HISTORY — DX: Hyperlipidemia, unspecified: E78.5

## 2021-07-16 HISTORY — DX: Unspecified osteoarthritis, unspecified site: M19.90

## 2021-07-16 HISTORY — DX: Other seasonal allergic rhinitis: J30.2

## 2021-07-16 HISTORY — DX: Essential (primary) hypertension: I10

## 2021-07-16 HISTORY — DX: Pneumonia, unspecified organism: J18.9

## 2021-07-16 HISTORY — DX: Gastro-esophageal reflux disease without esophagitis: K21.9

## 2021-07-16 HISTORY — DX: Type 2 diabetes mellitus without complications: E11.9

## 2021-07-16 HISTORY — DX: Unspecified asthma, uncomplicated: J45.909

## 2021-07-16 HISTORY — DX: Heart failure, unspecified: I50.9

## 2021-07-16 HISTORY — DX: Dyspnea, unspecified: R06.00

## 2021-07-16 LAB — BASIC METABOLIC PANEL
Anion gap: 11 (ref 5–15)
BUN: 13 mg/dL (ref 6–20)
CO2: 26 mmol/L (ref 22–32)
Calcium: 9.9 mg/dL (ref 8.9–10.3)
Chloride: 103 mmol/L (ref 98–111)
Creatinine, Ser: 1.27 mg/dL — ABNORMAL HIGH (ref 0.61–1.24)
GFR, Estimated: >60 mL/min (ref >60)
Glucose, Bld: 273 mg/dL — ABNORMAL HIGH (ref 70–99)
Potassium: 3.9 mmol/L (ref 3.5–5.1)
Sodium: 140 mmol/L (ref 135–145)

## 2021-07-16 LAB — SURGICAL PCR SCREEN
MRSA, PCR: NEGATIVE
Staphylococcus aureus: NEGATIVE

## 2021-07-16 LAB — GLUCOSE, CAPILLARY
Glucose-Capillary: 272 mg/dL — ABNORMAL HIGH (ref 70–99)
Glucose-Capillary: 244 mg/dL — ABNORMAL HIGH (ref 70–99)
Glucose-Capillary: 272 mg/dL — ABNORMAL HIGH (ref 70–99)
Glucose-Capillary: 205 mg/dL — ABNORMAL HIGH (ref 70–99)

## 2021-07-16 LAB — PROTIME-INR
INR: 1 (ref 0.8–1.2)
Prothrombin Time: 12.7 seconds (ref 11.4–15.2)

## 2021-07-16 LAB — TYPE AND SCREEN
ABO/RH(D): B POS
Antibody Screen: NEGATIVE

## 2021-07-16 LAB — HEMOGLOBIN A1C
Hgb A1c MFr Bld: 10.2 % — ABNORMAL HIGH (ref 4.8–5.6)
Mean Plasma Glucose: 246 mg/dL

## 2021-07-16 LAB — ABO/RH: ABO/RH(D): B POS

## 2021-07-16 SURGERY — POSTERIOR CERVICAL FUSION/FORAMINOTOMY LEVEL 4
Anesthesia: General

## 2021-07-16 MED ORDER — FENTANYL CITRATE (PF) 250 MCG/5ML IJ SOLN
INTRAMUSCULAR | Status: DC | PRN
  Administered 2021-07-16: 50 ug via INTRAVENOUS
  Administered 2021-07-16: 100 ug via INTRAVENOUS
  Administered 2021-07-16: 50 ug via INTRAVENOUS

## 2021-07-16 MED ORDER — THROMBIN 20000 UNITS EX SOLR
CUTANEOUS | Status: DC | PRN
  Administered 2021-07-16: 20 mL via TOPICAL

## 2021-07-16 MED ORDER — SUGAMMADEX SODIUM 200 MG/2ML IV SOLN
INTRAVENOUS | Status: DC | PRN
  Administered 2021-07-16: 400 mg via INTRAVENOUS

## 2021-07-16 MED ORDER — CEFAZOLIN SODIUM-DEXTROSE 2-4 GM/100ML-% IV SOLN
2.0000 g | INTRAVENOUS | Status: AC
  Administered 2021-07-16: 2 g via INTRAVENOUS

## 2021-07-16 MED ORDER — BUPIVACAINE HCL (PF) 0.25 % IJ SOLN
INTRAMUSCULAR | Status: AC
  Filled 2021-07-16: qty 30

## 2021-07-16 MED ORDER — LIDOCAINE 2% (20 MG/ML) 5 ML SYRINGE
INTRAMUSCULAR | Status: DC | PRN
  Administered 2021-07-16: 100 mg via INTRAVENOUS

## 2021-07-16 MED ORDER — EMPAGLIFLOZIN 10 MG PO TABS
10.0000 mg | ORAL_TABLET | Freq: Every day | ORAL | Status: DC
Start: 2021-07-16 — End: 2021-07-17
  Administered 2021-07-16 – 2021-07-17 (×2): 10 mg via ORAL
  Filled 2021-07-16 (×2): qty 1

## 2021-07-16 MED ORDER — MIDAZOLAM HCL 2 MG/2ML IJ SOLN
INTRAMUSCULAR | Status: AC
Start: 2021-07-16 — End: ?
  Filled 2021-07-16: qty 2

## 2021-07-16 MED ORDER — MORPHINE SULFATE (PF) 2 MG/ML IV SOLN: 2.0000 mg | INTRAVENOUS | Status: DC | PRN

## 2021-07-16 MED ORDER — ACETAMINOPHEN 500 MG PO TABS: 1000.0000 mg | ORAL_TABLET | ORAL | Status: AC

## 2021-07-16 MED ORDER — SENNA 8.6 MG PO TABS
1.0000 | ORAL_TABLET | Freq: Two times a day (BID) | ORAL | Status: DC
  Administered 2021-07-16 – 2021-07-17 (×2): 8.6 mg via ORAL
  Filled 2021-07-16 (×2): qty 1

## 2021-07-16 MED ORDER — LACTATED RINGERS IV SOLN: INTRAVENOUS | Status: DC

## 2021-07-16 MED ORDER — FENTANYL CITRATE (PF) 100 MCG/2ML IJ SOLN
25.0000 ug | INTRAMUSCULAR | Status: DC | PRN
  Administered 2021-07-16 (×2): 50 ug via INTRAVENOUS

## 2021-07-16 MED ORDER — INSULIN ASPART 100 UNIT/ML IJ SOLN
INTRAMUSCULAR | Status: AC
  Administered 2021-07-16: 8 [IU] via SUBCUTANEOUS
  Filled 2021-07-16: qty 1

## 2021-07-16 MED ORDER — GABAPENTIN 100 MG PO CAPS
100.0000 mg | ORAL_CAPSULE | Freq: Three times a day (TID) | ORAL | Status: DC
  Administered 2021-07-16 – 2021-07-17 (×3): 100 mg via ORAL
  Filled 2021-07-16 (×3): qty 1

## 2021-07-16 MED ORDER — THROMBIN 5000 UNITS EX SOLR
CUTANEOUS | Status: AC
  Filled 2021-07-16: qty 5000

## 2021-07-16 MED ORDER — METOPROLOL TARTRATE 12.5 MG HALF TABLET
12.5000 mg | ORAL_TABLET | Freq: Two times a day (BID) | ORAL | Status: DC
  Administered 2021-07-16 – 2021-07-17 (×2): 12.5 mg via ORAL
  Filled 2021-07-16 (×2): qty 1

## 2021-07-16 MED ORDER — ALBUTEROL SULFATE HFA 108 (90 BASE) MCG/ACT IN AERS
INHALATION_SPRAY | RESPIRATORY_TRACT | Status: DC | PRN
  Administered 2021-07-16 (×2): 4 via RESPIRATORY_TRACT

## 2021-07-16 MED ORDER — ONDANSETRON HCL 4 MG/2ML IJ SOLN: 4.0000 mg | Freq: Four times a day (QID) | INTRAMUSCULAR | Status: DC | PRN

## 2021-07-16 MED ORDER — GABAPENTIN 300 MG PO CAPS: 300.0000 mg | ORAL_CAPSULE | ORAL | Status: AC

## 2021-07-16 MED ORDER — METFORMIN HCL ER 500 MG PO TB24
500.0000 mg | ORAL_TABLET | Freq: Two times a day (BID) | ORAL | Status: DC
  Administered 2021-07-16 – 2021-07-17 (×2): 500 mg via ORAL
  Filled 2021-07-16 (×2): qty 1

## 2021-07-16 MED ORDER — ALBUMIN HUMAN 5 % IV SOLN: INTRAVENOUS | Status: DC | PRN

## 2021-07-16 MED ORDER — PROPOFOL 10 MG/ML IV BOLUS
INTRAVENOUS | Status: DC | PRN
  Administered 2021-07-16: 50 mg via INTRAVENOUS
  Administered 2021-07-16: 200 mg via INTRAVENOUS

## 2021-07-16 MED ORDER — ACETAMINOPHEN 500 MG PO TABS
ORAL_TABLET | ORAL | Status: AC
  Administered 2021-07-16: 1000 mg via ORAL
  Filled 2021-07-16: qty 2

## 2021-07-16 MED ORDER — POTASSIUM CHLORIDE IN NACL 20-0.9 MEQ/L-% IV SOLN: INTRAVENOUS | Status: DC

## 2021-07-16 MED ORDER — CHLORHEXIDINE GLUCONATE CLOTH 2 % EX PADS: 6.0000 | MEDICATED_PAD | Freq: Once | CUTANEOUS | Status: DC

## 2021-07-16 MED ORDER — FENTANYL CITRATE (PF) 100 MCG/2ML IJ SOLN
INTRAMUSCULAR | Status: AC
  Filled 2021-07-16: qty 2

## 2021-07-16 MED ORDER — CHLORHEXIDINE GLUCONATE 0.12 % MT SOLN
OROMUCOSAL | Status: AC
  Administered 2021-07-16: 15 mL via OROMUCOSAL
  Filled 2021-07-16: qty 15

## 2021-07-16 MED ORDER — CYCLOBENZAPRINE HCL 10 MG PO TABS
10.0000 mg | ORAL_TABLET | Freq: Two times a day (BID) | ORAL | Status: DC | PRN
  Administered 2021-07-16 (×2): 10 mg via ORAL
  Filled 2021-07-16 (×2): qty 1

## 2021-07-16 MED ORDER — ROCURONIUM BROMIDE 10 MG/ML (PF) SYRINGE
PREFILLED_SYRINGE | INTRAVENOUS | Status: AC
  Filled 2021-07-16: qty 20

## 2021-07-16 MED ORDER — CEFAZOLIN SODIUM-DEXTROSE 2-4 GM/100ML-% IV SOLN
INTRAVENOUS | Status: AC
  Filled 2021-07-16: qty 100

## 2021-07-16 MED ORDER — AMLODIPINE BESYLATE 5 MG PO TABS
10.0000 mg | ORAL_TABLET | Freq: Every day | ORAL | Status: DC
  Administered 2021-07-16: 10 mg via ORAL
  Filled 2021-07-16: qty 2

## 2021-07-16 MED ORDER — LOSARTAN POTASSIUM 50 MG PO TABS
50.0000 mg | ORAL_TABLET | Freq: Every day | ORAL | Status: DC
  Administered 2021-07-16 – 2021-07-17 (×2): 50 mg via ORAL
  Filled 2021-07-16 (×2): qty 1

## 2021-07-16 MED ORDER — DEXAMETHASONE SODIUM PHOSPHATE 10 MG/ML IJ SOLN
INTRAMUSCULAR | Status: DC | PRN
  Administered 2021-07-16: 5 mg via INTRAVENOUS

## 2021-07-16 MED ORDER — FENTANYL CITRATE (PF) 250 MCG/5ML IJ SOLN
INTRAMUSCULAR | Status: AC
  Filled 2021-07-16: qty 5

## 2021-07-16 MED ORDER — ACETAMINOPHEN 500 MG PO TABS
1000.0000 mg | ORAL_TABLET | Freq: Four times a day (QID) | ORAL | Status: DC
  Administered 2021-07-16 – 2021-07-17 (×3): 1000 mg via ORAL
  Filled 2021-07-16 (×3): qty 2

## 2021-07-16 MED ORDER — ONDANSETRON HCL 4 MG PO TABS: 4.0000 mg | ORAL_TABLET | Freq: Four times a day (QID) | ORAL | Status: DC | PRN

## 2021-07-16 MED ORDER — UMECLIDINIUM BROMIDE 62.5 MCG/ACT IN AEPB
1.0000 | INHALATION_SPRAY | Freq: Every day | RESPIRATORY_TRACT | Status: DC
  Filled 2021-07-16: qty 7

## 2021-07-16 MED ORDER — GABAPENTIN 300 MG PO CAPS
ORAL_CAPSULE | ORAL | Status: AC
  Administered 2021-07-16: 300 mg via ORAL
  Filled 2021-07-16: qty 1

## 2021-07-16 MED ORDER — ONDANSETRON HCL 4 MG/2ML IJ SOLN
INTRAMUSCULAR | Status: DC | PRN
  Administered 2021-07-16: 4 mg via INTRAVENOUS

## 2021-07-16 MED ORDER — SODIUM CHLORIDE 0.9% FLUSH: 3.0000 mL | INTRAVENOUS | Status: DC | PRN

## 2021-07-16 MED ORDER — THROMBIN 5000 UNITS EX SOLR
CUTANEOUS | Status: AC
  Filled 2021-07-16: qty 10000

## 2021-07-16 MED ORDER — 0.9 % SODIUM CHLORIDE (POUR BTL) OPTIME
TOPICAL | Status: DC | PRN
  Administered 2021-07-16: 1000 mL

## 2021-07-16 MED ORDER — BUDESONIDE 0.25 MG/2ML IN SUSP
0.2500 mg | Freq: Two times a day (BID) | RESPIRATORY_TRACT | Status: DC
  Administered 2021-07-16: 0.25 mg via RESPIRATORY_TRACT
  Filled 2021-07-16: qty 2

## 2021-07-16 MED ORDER — ROCURONIUM BROMIDE 10 MG/ML (PF) SYRINGE
PREFILLED_SYRINGE | INTRAVENOUS | Status: DC | PRN
  Administered 2021-07-16: 20 mg via INTRAVENOUS
  Administered 2021-07-16: 50 mg via INTRAVENOUS
  Administered 2021-07-16: 40 mg via INTRAVENOUS
  Administered 2021-07-16: 30 mg via INTRAVENOUS
  Administered 2021-07-16: 60 mg via INTRAVENOUS

## 2021-07-16 MED ORDER — BACITRACIN ZINC 500 UNIT/GM EX OINT
TOPICAL_OINTMENT | CUTANEOUS | Status: AC
  Filled 2021-07-16: qty 28.35

## 2021-07-16 MED ORDER — DEXAMETHASONE SODIUM PHOSPHATE 10 MG/ML IJ SOLN
INTRAMUSCULAR | Status: AC
  Filled 2021-07-16: qty 1

## 2021-07-16 MED ORDER — THROMBIN 20000 UNITS EX SOLR
CUTANEOUS | Status: AC
  Filled 2021-07-16: qty 20000

## 2021-07-16 MED ORDER — ONDANSETRON HCL 4 MG/2ML IJ SOLN
INTRAMUSCULAR | Status: AC
  Filled 2021-07-16: qty 2

## 2021-07-16 MED ORDER — OXYCODONE HCL 5 MG PO TABS
10.0000 mg | ORAL_TABLET | ORAL | Status: DC | PRN
  Administered 2021-07-16 – 2021-07-17 (×6): 10 mg via ORAL
  Filled 2021-07-16 (×6): qty 2

## 2021-07-16 MED ORDER — CEFAZOLIN SODIUM-DEXTROSE 2-4 GM/100ML-% IV SOLN
2.0000 g | Freq: Three times a day (TID) | INTRAVENOUS | Status: AC
  Administered 2021-07-16 – 2021-07-17 (×2): 2 g via INTRAVENOUS
  Filled 2021-07-16 (×2): qty 100

## 2021-07-16 MED ORDER — THROMBIN 5000 UNITS EX SOLR
OROMUCOSAL | Status: DC | PRN
  Administered 2021-07-16 (×2): 5 mL via TOPICAL

## 2021-07-16 MED ORDER — ALBUTEROL SULFATE (2.5 MG/3ML) 0.083% IN NEBU: 3.0000 mL | INHALATION_SOLUTION | Freq: Four times a day (QID) | RESPIRATORY_TRACT | Status: DC | PRN

## 2021-07-16 MED ORDER — SODIUM CHLORIDE 0.9% FLUSH: 3.0000 mL | Freq: Two times a day (BID) | INTRAVENOUS | Status: DC

## 2021-07-16 MED ORDER — SODIUM CHLORIDE 0.9 % IV SOLN
250.0000 mL | INTRAVENOUS | Status: DC
  Administered 2021-07-16: 250 mL via INTRAVENOUS

## 2021-07-16 MED ORDER — SPIRONOLACTONE 12.5 MG HALF TABLET
12.5000 mg | ORAL_TABLET | Freq: Every day | ORAL | Status: DC
  Administered 2021-07-17: 12.5 mg via ORAL
  Filled 2021-07-16 (×2): qty 1

## 2021-07-16 MED ORDER — LIDOCAINE 2% (20 MG/ML) 5 ML SYRINGE
INTRAMUSCULAR | Status: AC
  Filled 2021-07-16: qty 5

## 2021-07-16 MED ORDER — BUPIVACAINE HCL (PF) 0.25 % IJ SOLN
INTRAMUSCULAR | Status: DC | PRN
  Administered 2021-07-16: 7 mL

## 2021-07-16 MED ORDER — INSULIN ASPART 100 UNIT/ML IJ SOLN: 0.0000 [IU] | INTRAMUSCULAR | Status: DC | PRN

## 2021-07-16 MED ORDER — INSULIN ASPART 100 UNIT/ML IJ SOLN
0.0000 [IU] | Freq: Three times a day (TID) | INTRAMUSCULAR | Status: DC
  Administered 2021-07-16: 8 [IU] via SUBCUTANEOUS
  Administered 2021-07-17: 3 [IU] via SUBCUTANEOUS

## 2021-07-16 MED ORDER — MONTELUKAST SODIUM 10 MG PO TABS
10.0000 mg | ORAL_TABLET | Freq: Every day | ORAL | Status: DC
  Administered 2021-07-16: 10 mg via ORAL
  Filled 2021-07-16: qty 1

## 2021-07-16 MED ORDER — MIDAZOLAM HCL 2 MG/2ML IJ SOLN
INTRAMUSCULAR | Status: DC | PRN
  Administered 2021-07-16: 2 mg via INTRAVENOUS

## 2021-07-16 MED ORDER — PHENYLEPHRINE HCL-NACL 20-0.9 MG/250ML-% IV SOLN
INTRAVENOUS | Status: DC | PRN
  Administered 2021-07-16: 20 ug/min via INTRAVENOUS

## 2021-07-16 MED ORDER — MENTHOL 3 MG MT LOZG: 1.0000 | LOZENGE | OROMUCOSAL | Status: DC | PRN

## 2021-07-16 MED ORDER — ARFORMOTEROL TARTRATE 15 MCG/2ML IN NEBU
15.0000 ug | INHALATION_SOLUTION | Freq: Two times a day (BID) | RESPIRATORY_TRACT | Status: DC
  Administered 2021-07-16: 15 ug via RESPIRATORY_TRACT
  Filled 2021-07-16 (×3): qty 2

## 2021-07-16 MED ORDER — CHLORHEXIDINE GLUCONATE 0.12 % MT SOLN: 15.0000 mL | Freq: Once | OROMUCOSAL | Status: AC

## 2021-07-16 MED ORDER — PROPOFOL 10 MG/ML IV BOLUS
INTRAVENOUS | Status: AC
Start: 2021-07-16 — End: ?
  Filled 2021-07-16: qty 20

## 2021-07-16 MED ORDER — ORAL CARE MOUTH RINSE: 15.0000 mL | Freq: Once | OROMUCOSAL | Status: AC

## 2021-07-16 MED ORDER — PHENOL 1.4 % MT LIQD: 1.0000 | OROMUCOSAL | Status: DC | PRN

## 2021-07-16 MED ORDER — CELECOXIB 200 MG PO CAPS
200.0000 mg | ORAL_CAPSULE | Freq: Two times a day (BID) | ORAL | Status: DC
  Administered 2021-07-16 – 2021-07-17 (×2): 200 mg via ORAL
  Filled 2021-07-16 (×2): qty 1

## 2021-07-16 SURGICAL SUPPLY — 59 items
BAG COUNTER SPONGE SURGICOUNT (BAG) ×2 IMPLANT
BENZOIN TINCTURE PRP APPL 2/3 (GAUZE/BANDAGES/DRESSINGS) ×2 IMPLANT
BIT DRILL 2.3X12 (BIT) ×1 IMPLANT
BLADE BONE MILL MEDIUM (MISCELLANEOUS) ×1 IMPLANT
BLADE CLIPPER SURG (BLADE) IMPLANT
BUR CARBIDE MATCH 3.0 (BURR) ×2 IMPLANT
CANISTER SUCT 3000ML PPV (MISCELLANEOUS) ×2 IMPLANT
DRAPE C-ARM 42X72 X-RAY (DRAPES) ×4 IMPLANT
DRAPE LAPAROTOMY 100X72 PEDS (DRAPES) ×2 IMPLANT
DRSG OPSITE POSTOP 4X6 (GAUZE/BANDAGES/DRESSINGS) ×1 IMPLANT
DURAPREP 6ML APPLICATOR 50/CS (WOUND CARE) ×2 IMPLANT
ELECT BLADE 4.0 EZ CLEAN MEGAD (MISCELLANEOUS) ×2
ELECT REM PT RETURN 9FT ADLT (ELECTROSURGICAL) ×2
ELECTRODE BLDE 4.0 EZ CLN MEGD (MISCELLANEOUS) IMPLANT
ELECTRODE REM PT RTRN 9FT ADLT (ELECTROSURGICAL) ×1 IMPLANT
EVACUATOR 1/8 PVC DRAIN (DRAIN) ×1 IMPLANT
GAUZE 4X4 16PLY ~~LOC~~+RFID DBL (SPONGE) IMPLANT
GAUZE SPONGE 4X4 12PLY STRL (GAUZE/BANDAGES/DRESSINGS) ×2 IMPLANT
GLOVE BIO SURGEON STRL SZ7 (GLOVE) ×2 IMPLANT
GLOVE BIO SURGEON STRL SZ8 (GLOVE) ×2 IMPLANT
GLOVE BIOGEL PI IND STRL 7.0 (GLOVE) IMPLANT
GLOVE BIOGEL PI IND STRL 7.5 (GLOVE) IMPLANT
GLOVE BIOGEL PI INDICATOR 7.0 (GLOVE) ×2
GLOVE BIOGEL PI INDICATOR 7.5 (GLOVE) ×2
GLOVE EXAM NITRILE XL STR (GLOVE) IMPLANT
GOWN STRL REUS W/ TWL LRG LVL3 (GOWN DISPOSABLE) IMPLANT
GOWN STRL REUS W/ TWL XL LVL3 (GOWN DISPOSABLE) ×1 IMPLANT
GOWN STRL REUS W/TWL 2XL LVL3 (GOWN DISPOSABLE) IMPLANT
GOWN STRL REUS W/TWL LRG LVL3 (GOWN DISPOSABLE) ×1
GOWN STRL REUS W/TWL XL LVL3 (GOWN DISPOSABLE) ×2
HEMOSTAT POWDER KIT SURGIFOAM (HEMOSTASIS) ×2 IMPLANT
KIT BASIN OR (CUSTOM PROCEDURE TRAY) ×2 IMPLANT
KIT TURNOVER KIT B (KITS) ×2 IMPLANT
MARKER SKIN DUAL TIP RULER LAB (MISCELLANEOUS) ×3 IMPLANT
MILL BONE PREP (MISCELLANEOUS) ×1 IMPLANT
NDL HYPO 18GX1.5 BLUNT FILL (NEEDLE) IMPLANT
NDL HYPO 25X1 1.5 SAFETY (NEEDLE) ×1 IMPLANT
NDL SPNL 20GX3.5 QUINCKE YW (NEEDLE) ×1 IMPLANT
NEEDLE HYPO 18GX1.5 BLUNT FILL (NEEDLE) IMPLANT
NEEDLE HYPO 25X1 1.5 SAFETY (NEEDLE) ×2 IMPLANT
NEEDLE SPNL 20GX3.5 QUINCKE YW (NEEDLE) IMPLANT
NS IRRIG 1000ML POUR BTL (IV SOLUTION) ×2 IMPLANT
PACK LAMINECTOMY NEURO (CUSTOM PROCEDURE TRAY) ×2 IMPLANT
PIN MAYFIELD SKULL DISP (PIN) ×2 IMPLANT
PUTTY DBM 5CC (Putty) ×1 IMPLANT
ROD LORD TI 3.5X75 (Rod) ×2 IMPLANT
SCREW POLYAXIAL 3.5 X 14 (Screw) ×10 IMPLANT
SCREW SET ATEC (Screw) ×10 IMPLANT
SPONGE SURGIFOAM ABS GEL 100 (HEMOSTASIS) ×2 IMPLANT
STRIP CLOSURE SKIN 1/2X4 (GAUZE/BANDAGES/DRESSINGS) ×2 IMPLANT
SUT VIC AB 0 CT1 18XCR BRD8 (SUTURE) ×1 IMPLANT
SUT VIC AB 0 CT1 8-18 (SUTURE) ×2
SUT VIC AB 2-0 CP2 18 (SUTURE) ×3 IMPLANT
SUT VIC AB 3-0 SH 8-18 (SUTURE) ×2 IMPLANT
TOWEL GREEN STERILE (TOWEL DISPOSABLE) ×2 IMPLANT
TOWEL GREEN STERILE FF (TOWEL DISPOSABLE) ×2 IMPLANT
TRAY FOLEY MTR SLVR 16FR STAT (SET/KITS/TRAYS/PACK) ×1 IMPLANT
UNDERPAD 30X36 HEAVY ABSORB (UNDERPADS AND DIAPERS) ×2 IMPLANT
WATER STERILE IRR 1000ML POUR (IV SOLUTION) ×2 IMPLANT

## 2021-07-16 NOTE — Anesthesia Procedure Notes (Addendum)
Procedure Name: Intubation Date/Time: 07/16/2021 10:44 AM  Performed by: Zollie Beckers, CRNAPre-anesthesia Checklist: Patient identified, Emergency Drugs available, Suction available and Patient being monitored Patient Re-evaluated:Patient Re-evaluated prior to induction Oxygen Delivery Method: Circle system utilized Preoxygenation: Pre-oxygenation with 100% oxygen Induction Type: IV induction Ventilation: Mask ventilation without difficulty Laryngoscope Size: Glidescope and 4 Grade View: Grade I Tube type: Oral Tube size: 7.5 mm Number of attempts: 1 Airway Equipment and Method: Video-laryngoscopy and Rigid stylet Placement Confirmation: ETT inserted through vocal cords under direct vision, positive ETCO2 and breath sounds checked- equal and bilateral Secured at: 24 cm Tube secured with: Tape Dental Injury: Teeth and Oropharynx as per pre-operative assessment  Difficulty Due To: Difficult Airway- due to anterior larynx Comments: Intubated by Rennis Harding, SRNA under direct supervision of CRNA and MDA. Cspine maintained in neutral position during video laryngoscopy.

## 2021-07-16 NOTE — Transfer of Care (Signed)
Immediate Anesthesia Transfer of Care Note  Patient: Manuel [name]  Procedure(s) Performed: Posterior cervical fusion with lateral mass fixation - Cervical Three-Cervical Seven,, cervical laminectomy  - Cervical Three-Cevical Six  Patient Location: PACU  Anesthesia Type:General  Level of Consciousness: drowsy  Airway & Oxygen Therapy: Patient Spontanous Breathing and Patient connected to face mask oxygen  Post-op Assessment: Report given to RN, Post -op Vital signs reviewed and stable and Patient moving all extremities X 4  Post vital signs: Reviewed and stable  Last Vitals:  Vitals Value Taken Time  BP 144/89 07/16/21 1401  Temp    Pulse 93 07/16/21 1404  Resp 15 07/16/21 1404  SpO2 97 % 07/16/21 1404  Vitals shown include unvalidated device data.  Last Pain:  Vitals:   07/16/21 0844  TempSrc:   PainSc: 2       Patients Stated Pain Goal: 2 (07/16/21 0844)  Complications: No notable events documented.

## 2021-07-16 NOTE — Anesthesia Postprocedure Evaluation (Signed)
Anesthesia Post Note  Patient: Manuel [name]  Procedure(s) Performed: Posterior cervical fusion with lateral mass fixation - Cervical Three-Cervical Seven,, cervical laminectomy  - Cervical Three-Cevical Six     Patient location during evaluation: PACU Anesthesia Type: General Level of consciousness: awake and alert Pain management: pain level controlled Vital Signs Assessment: post-procedure vital signs reviewed and stable Respiratory status: spontaneous breathing, nonlabored ventilation, respiratory function stable and patient connected to nasal cannula oxygen Cardiovascular status: blood pressure returned to baseline and stable Postop Assessment: no apparent nausea or vomiting Anesthetic complications: no   No notable events documented.  Last Vitals:  Vitals:   07/16/21 1515 07/16/21 1530  BP: (!) 140/92 (!) 131/93  Pulse: 99 98  Resp: 14 11  Temp:    SpO2: 99%     Last Pain:  Vitals:   07/16/21 1413  TempSrc:   PainSc: 8                  Elvan Ebron P Yanice Maqueda

## 2021-07-17 ENCOUNTER — Encounter (HOSPITAL_COMMUNITY): Payer: Self-pay | Admitting: Neurological Surgery

## 2021-07-17 LAB — GLUCOSE, CAPILLARY: Glucose-Capillary: 194 mg/dL — ABNORMAL HIGH (ref 70–99)

## 2021-07-17 MED ORDER — OXYCODONE HCL 10 MG PO TABS
10.0000 mg | ORAL_TABLET | ORAL | 0 refills | Status: DC | PRN
Start: 2021-07-17 — End: 2021-07-23

## 2021-07-17 MED ORDER — CYCLOBENZAPRINE HCL 10 MG PO TABS
10.0000 mg | ORAL_TABLET | Freq: Three times a day (TID) | ORAL | 1 refills | Status: AC | PRN
Start: 2021-07-17 — End: ?

## 2021-07-17 MED FILL — Thrombin For Soln 5000 Unit: CUTANEOUS | Qty: 5000 | Status: AC

## 2021-07-22 ENCOUNTER — Ambulatory Visit (HOSPITAL_COMMUNITY)
Admission: EM | Admit: 2021-07-22 | Discharge: 2021-07-22 | Disposition: A | Discharge status: Home / Self Care | Payer: No Typology Code available for payment source

## 2021-07-22 ENCOUNTER — Other Ambulatory Visit: Payer: Self-pay

## 2021-07-22 ENCOUNTER — Encounter (HOSPITAL_COMMUNITY): Payer: Self-pay

## 2021-07-22 ENCOUNTER — Emergency Department (HOSPITAL_COMMUNITY): Payer: No Typology Code available for payment source

## 2021-07-22 ENCOUNTER — Inpatient Hospital Stay (HOSPITAL_COMMUNITY)
Admission: EM | Admit: 2021-07-22 | Discharge: 2021-07-23 | DRG: 948 | Disposition: A | Discharge status: Home / Self Care | Payer: No Typology Code available for payment source | Attending: Neurological Surgery | Admitting: Neurological Surgery

## 2021-07-22 DIAGNOSIS — M5489 Other dorsalgia: Secondary | ICD-10-CM | POA: Diagnosis present

## 2021-07-22 DIAGNOSIS — T8149XA Infection following a procedure, other surgical site, initial encounter: Secondary | ICD-10-CM | POA: Diagnosis present

## 2021-07-22 DIAGNOSIS — E119 Type 2 diabetes mellitus without complications: Secondary | ICD-10-CM | POA: Diagnosis present

## 2021-07-22 DIAGNOSIS — T889XXA Complication of surgical and medical care, unspecified, initial encounter: Secondary | ICD-10-CM

## 2021-07-22 DIAGNOSIS — R202 Paresthesia of skin: Secondary | ICD-10-CM | POA: Diagnosis present

## 2021-07-22 DIAGNOSIS — Z9889 Other specified postprocedural states: Secondary | ICD-10-CM

## 2021-07-22 DIAGNOSIS — Z4889 Encounter for other specified surgical aftercare: Secondary | ICD-10-CM

## 2021-07-22 DIAGNOSIS — M542 Cervicalgia: Secondary | ICD-10-CM | POA: Diagnosis present

## 2021-07-22 DIAGNOSIS — Z981 Arthrodesis status: Secondary | ICD-10-CM

## 2021-07-22 DIAGNOSIS — G8918 Other acute postprocedural pain: Principal | ICD-10-CM | POA: Diagnosis present

## 2021-07-22 LAB — CBC WITH DIFFERENTIAL/PLATELET
Abs Immature Granulocytes: 0.02 10*3/uL (ref 0.00–0.07)
Basophils Absolute: 0 10*3/uL (ref 0.0–0.1)
Basophils Relative: 0 %
Eosinophils Absolute: 0.1 10*3/uL (ref 0.0–0.5)
Eosinophils Relative: 2 %
HCT: 41.9 % (ref 39.0–52.0)
Hemoglobin: 13.3 g/dL (ref 13.0–17.0)
Immature Granulocytes: 0 %
Lymphocytes Relative: 19 %
Lymphs Abs: 1.3 10*3/uL (ref 0.7–4.0)
MCH: 26.9 pg (ref 26.0–34.0)
MCHC: 31.7 g/dL (ref 30.0–36.0)
MCV: 84.6 fL (ref 80.0–100.0)
Monocytes Absolute: 0.8 10*3/uL (ref 0.1–1.0)
Monocytes Relative: 12 %
Neutro Abs: 4.8 10*3/uL (ref 1.7–7.7)
Neutrophils Relative %: 67 %
Platelets: 220 10*3/uL (ref 150–400)
RBC: 4.95 MIL/uL (ref 4.22–5.81)
RDW: 12.3 % (ref 11.5–15.5)
WBC: 7.1 10*3/uL (ref 4.0–10.5)
nRBC: 0 % (ref 0.0–0.2)

## 2021-07-22 LAB — BASIC METABOLIC PANEL
Anion gap: 13 (ref 5–15)
BUN: 17 mg/dL (ref 6–20)
CO2: 25 mmol/L (ref 22–32)
Calcium: 9.2 mg/dL (ref 8.9–10.3)
Chloride: 95 mmol/L — ABNORMAL LOW (ref 98–111)
Creatinine, Ser: 1.23 mg/dL (ref 0.61–1.24)
GFR, Estimated: >60 mL/min (ref >60)
Glucose, Bld: 203 mg/dL — ABNORMAL HIGH (ref 70–99)
Potassium: 3.4 mmol/L — ABNORMAL LOW (ref 3.5–5.1)
Sodium: 133 mmol/L — ABNORMAL LOW (ref 135–145)

## 2021-07-22 LAB — LACTIC ACID, PLASMA: Lactic Acid, Venous: 1.4 mmol/L (ref 0.5–1.9)

## 2021-07-22 MED ORDER — GADOBUTROL 1 MMOL/ML IV SOLN
10.0000 mL | Freq: Once | INTRAVENOUS | Status: AC | PRN
Start: 2021-07-22 — End: 2021-07-22
  Administered 2021-07-22: 10 mL via INTRAVENOUS

## 2021-07-22 MED ORDER — CEPHALEXIN 250 MG PO CAPS
500.0000 mg | ORAL_CAPSULE | Freq: Four times a day (QID) | ORAL | Status: DC
  Administered 2021-07-22 – 2021-07-23 (×4): 500 mg via ORAL
  Filled 2021-07-22 (×4): qty 2

## 2021-07-22 MED ORDER — HYDROMORPHONE HCL 1 MG/ML IJ SOLN
0.5000 mg | Freq: Once | INTRAMUSCULAR | Status: AC
  Administered 2021-07-22: 0.5 mg via INTRAVENOUS
  Filled 2021-07-22: qty 1

## 2021-07-22 MED ORDER — OXYCODONE-ACETAMINOPHEN 5-325 MG PO TABS
1.0000 | ORAL_TABLET | Freq: Four times a day (QID) | ORAL | Status: DC | PRN
  Administered 2021-07-23 (×2): 1 via ORAL
  Filled 2021-07-22 (×2): qty 1

## 2021-07-22 MED ORDER — ONDANSETRON HCL 4 MG PO TABS: 4.0000 mg | ORAL_TABLET | Freq: Four times a day (QID) | ORAL | Status: DC | PRN

## 2021-07-22 MED ORDER — FLEET ENEMA 7-19 GM/118ML RE ENEM: 1.0000 | ENEMA | Freq: Once | RECTAL | Status: DC | PRN

## 2021-07-22 MED ORDER — BISACODYL 10 MG RE SUPP: 10.0000 mg | Freq: Every day | RECTAL | Status: DC | PRN

## 2021-07-22 MED ORDER — SODIUM CHLORIDE 0.9 % IV SOLN: INTRAVENOUS | Status: DC

## 2021-07-22 MED ORDER — HYDROMORPHONE HCL 1 MG/ML IJ SOLN
1.0000 mg | Freq: Once | INTRAMUSCULAR | Status: AC
  Administered 2021-07-22: 1 mg via INTRAVENOUS
  Filled 2021-07-22: qty 1

## 2021-07-22 MED ORDER — DOCUSATE SODIUM 100 MG PO CAPS
100.0000 mg | ORAL_CAPSULE | Freq: Two times a day (BID) | ORAL | Status: DC
  Administered 2021-07-22 – 2021-07-23 (×2): 100 mg via ORAL
  Filled 2021-07-22 (×2): qty 1

## 2021-07-22 MED ORDER — SODIUM CHLORIDE 0.9 % IV BOLUS
1000.0000 mL | Freq: Once | INTRAVENOUS | Status: AC
  Administered 2021-07-22: 1000 mL via INTRAVENOUS

## 2021-07-22 MED ORDER — POLYETHYLENE GLYCOL 3350 17 G PO PACK
17.0000 g | PACK | Freq: Every day | ORAL | Status: DC | PRN
Start: 2021-07-22 — End: 2021-07-24

## 2021-07-22 MED ORDER — CYCLOBENZAPRINE HCL 10 MG PO TABS
10.0000 mg | ORAL_TABLET | Freq: Three times a day (TID) | ORAL | Status: DC | PRN
  Administered 2021-07-23: 10 mg via ORAL
  Filled 2021-07-22: qty 1

## 2021-07-22 MED ORDER — HYDROMORPHONE HCL 1 MG/ML IJ SOLN
0.5000 mg | INTRAMUSCULAR | Status: DC | PRN
  Administered 2021-07-22: 1 mg via INTRAVENOUS
  Filled 2021-07-22: qty 1

## 2021-07-22 MED ORDER — ACETAMINOPHEN 325 MG PO TABS
650.0000 mg | ORAL_TABLET | Freq: Four times a day (QID) | ORAL | Status: DC | PRN
  Administered 2021-07-23: 650 mg via ORAL
  Filled 2021-07-22: qty 2

## 2021-07-22 MED ORDER — ONDANSETRON HCL 4 MG/2ML IJ SOLN: 4.0000 mg | Freq: Four times a day (QID) | INTRAMUSCULAR | Status: DC | PRN

## 2021-07-22 MED ORDER — GABAPENTIN 100 MG PO CAPS
100.0000 mg | ORAL_CAPSULE | Freq: Three times a day (TID) | ORAL | Status: DC
  Administered 2021-07-23 (×3): 100 mg via ORAL
  Filled 2021-07-22 (×3): qty 1

## 2021-07-22 MED ORDER — ACETAMINOPHEN 650 MG RE SUPP: 650.0000 mg | Freq: Four times a day (QID) | RECTAL | Status: DC | PRN

## 2021-07-22 MED ORDER — SENNA 8.6 MG PO TABS
1.0000 | ORAL_TABLET | Freq: Two times a day (BID) | ORAL | Status: DC
  Administered 2021-07-23: 8.6 mg via ORAL
  Filled 2021-07-22: qty 1

## 2021-07-22 NOTE — Discharge Instructions (Addendum)
-  Please head to the ED. We're concerned you have an infection of your spinal surgical wound, and this could get really serious quickly without proper intervention. Please head straight there.

## 2021-07-22 NOTE — ED Provider Notes (Signed)
[facility] EMERGENCY DEPARTMENT Provider Note   CSN: [id number] Arrival date & time: 07/22/21  1320     History PMH: HLD, HTN, DM, CHF, Asthma No chief complaint on file.   Manuel [name] is a 61 y.o. male. Patient presents the emergency department with worsening upper back pain.  He says that he had a posterior cervical fusion with lateral mass fixation, cervical laminectomy on July 16, 2021.  He said he was feeling better once he was discharged.  He says that about 2 days after he eats at home, he started experiencing excruciating pain that is just worsened.  He said that he has noticed bilateral pain shooting down both arm to his elbow.  He has also noticed bilateral paresthesias and numbness in his hands.  Said that they ended up taking the dressing off today, and noticed there was some bloody drainage.  They were concerned so they took him to urgent care.  Urgent care sent him to the emergency department for concern of infection due to patient being tachycardic and having new bloody drainage from the incision site. Patient denies any fevers, chills, gait abnormalities, bowel or bladder incontinence, chest pain, shortness of breath. HPI     Home Medications Prior to Admission medications   Medication Sig Start Date End Date Taking? Authorizing Provider  albuterol (VENTOLIN HFA) 108 (90 Base) MCG/ACT inhaler Inhale 2 puffs into the lungs every 6 (six) hours as needed for wheezing or shortness of breath.    [provider]  amLODipine (NORVASC) 10 MG tablet Take 10 mg by mouth daily.    [provider]  atorvastatin (LIPITOR) 40 MG tablet Take 40 mg by mouth daily.    [provider]  cetirizine (ZYRTEC) 10 MG tablet Take 10 mg by mouth at bedtime.    [provider]  cholecalciferol (VITAMIN D3) 25 MCG (1000 UNIT) tablet Take 1,000 Units by mouth daily.    [provider]  cyclobenzaprine (FLEXERIL) 10 MG tablet  Take 1 tablet (10 mg total) by mouth 3 (three) times daily as needed for muscle spasms. 07/17/21   Tia Alert, MD  empagliflozin (JARDIANCE) 25 MG TABS tablet Take 12.5 mg by mouth daily.    [provider]  famotidine (PEPCID) 20 MG tablet Take 20 mg by mouth daily as needed for heartburn or indigestion.    [provider]  fluticasone (FLONASE) 50 MCG/ACT nasal spray Place 2 sprays into both nostrils daily as needed for allergies or rhinitis.    [provider]  gabapentin (NEURONTIN) 100 MG capsule Take 100 mg by mouth 3 (three) times daily as needed (pain).    [provider]  losartan (COZAAR) 50 MG tablet Take 50 mg by mouth daily.    [provider]  meloxicam (MOBIC) 15 MG tablet Take 15 mg by mouth daily as needed for pain.    [provider]  metFORMIN (GLUCOPHAGE-XR) 500 MG 24 hr tablet Take 500 mg by mouth 2 (two) times daily.    [provider]  metoprolol tartrate (LOPRESSOR) 25 MG tablet Take 12.5 mg by mouth 2 (two) times daily.    [provider]  Mometasone Furoate [facility] HFA) 100 MCG/ACT AERO Inhale 2 puffs into the lungs at bedtime.    [provider]  montelukast (SINGULAIR) 10 MG tablet Take 10 mg by mouth at bedtime.    [provider]  oxyCODONE 10 MG TABS Take 1 tablet (10 mg total) by  mouth every 4 (four) hours as needed for severe pain ((score 7 to 10)). 07/17/21   Tia Alert, MD  spironolactone (ALDACTONE) 25 MG tablet Take 12.5 mg by mouth daily.    [provider]  Tiotropium Bromide-Olodaterol (STIOLTO RESPIMAT) 2.5-2.5 MCG/ACT AERS Inhale 2 each into the lungs daily.    [provider]      Allergies    Vasotec [enalapril maleate]    Review of Systems   Review of Systems  Constitutional:  Negative for chills and fever.  Respiratory:  Negative for shortness of breath.   Cardiovascular:  Negative for chest pain.  Musculoskeletal:  Positive for back  pain.  Skin:  Positive for wound.  Neurological:  Positive for numbness. Negative for weakness.  All other systems reviewed and are negative.   Physical Exam Updated Vital Signs BP (!) 136/91   Pulse (!) 123   Temp 98.1 F (36.7 C) (Oral)   Resp (!) 23   SpO2 93%  Physical Exam Vitals and nursing note reviewed.  Constitutional:      General: He is not in acute distress.    Appearance: Normal appearance. He is well-developed. He is not ill-appearing, toxic-appearing or diaphoretic.  HENT:     Head: Normocephalic and atraumatic.     Nose: No nasal deformity.     Mouth/Throat:     Lips: Pink. No lesions.  Eyes:     General: Gaze aligned appropriately. No scleral icterus.       Right eye: No discharge.        Left eye: No discharge.     Conjunctiva/sclera: Conjunctivae normal.     Right eye: Right conjunctiva is not injected. No exudate or hemorrhage.    Left eye: Left conjunctiva is not injected. No exudate or hemorrhage. Pulmonary:     Effort: Pulmonary effort is normal. No respiratory distress.  Skin:    General: Skin is warm and dry.     Comments: I assessed surgical incision site of posterior C-spine.  There is small amount of bloody drainage.  No purulent drainage.  There is no erythema or fluctuance surrounding surgical site.  Site is not tender to touch.  Neurological:     Mental Status: He is alert and oriented to person, place, and time.     Comments: Patient has 5 out of 5 strength in upper and lower extremities.  He has equal sensation bilaterally.  Subjective decrease in sensation in both hands.  Psychiatric:        Mood and Affect: Mood normal.        Speech: Speech normal.        Behavior: Behavior normal. Behavior is cooperative.      ED Results / Procedures / Treatments   Labs (all labs ordered are listed, but only abnormal results are displayed) Labs Reviewed  CBC WITH DIFFERENTIAL/PLATELET  BASIC METABOLIC PANEL  LACTIC ACID, PLASMA  LACTIC ACID,  PLASMA    EKG None  Radiology No results found.  Procedures Procedures   Medications Ordered in ED Medications  HYDROmorphone (DILAUDID) injection 1 mg (has no administration in time range)    ED Course/ Medical Decision Making/ A&P                           Medical Decision Making Amount and/or Complexity of Data Reviewed Labs: ordered. Radiology: ordered.  Risk Prescription drug management.    MDM  This is a 61 y.o.  male who presents to the ED with worsening back pain after cervical spine surgery The differential of this patient includes but is not limited to surgical site infection, epidural abscess, epidural hematoma, cord compression.  Initial Impression  Patient presents with worsening back pain 5 days status post cervical spine surgery done on the 27th. He is tachycardic here to 122.  Afebrile with hemodynamically stable vital signs. I assessed the incision site which does not look acutely infected.  There is some bloody drainage coming from the site.  He has a fairly unremarkable neurological exam except for subjective decrease sensation in bilateral hands. Since he is having new paresthesias along with bilateral radicular symptoms following surgery, plan to get MRI C-spine to assess for complications.   3:10 PM Care of Vaishnav Demartin transferred to [facility] and Dr. Freida Busman at the end of my shift as the patient will require reassessment once labs/imaging have resulted. Patient presentation, ED course, and plan of care discussed with review of all pertinent labs and imaging. Please see his/her note for further details regarding further ED course and disposition. Plan at time of handoff is f/u on workup, neurosurgery consult if needed following MRI, reassess pain. This may be altered or completely changed at the discretion of the oncoming team pending results of further workup.   Charting Requirements Additional history is obtained from:  Independent  historian External Records from outside source obtained and reviewed including: UC note, Surgical note Social Determinants of Health:  none Pertinant PMH that complicates patient's illness: Recent spinal surgery  Patient Care Problems that were addressed during this visit: - back pain: Acute illness - Paresthesias: Acute illness Medications given in ED: Dilaudid Reevaluation of the patient after these medicines showed that the patient  needs reassessment I have reviewed home medications and made changes accordingly.  Critical Care Interventions: n/a Consultations: n/a Disposition: see next provider note  This is a supervised visit with my attending physician, Dr. Criss Alvine. We have discussed this patient and they have altered the plan as needed.  Portions of this note were generated with Scientist, clinical (histocompatibility and immunogenetics). Dictation errors may occur despite best attempts at proofreading.    Final Clinical Impression(s) / ED Diagnoses Final diagnoses:  None    Rx / DC Orders ED Discharge Orders     None         Claudie Leach, PA-C 07/22/21 1510    Pricilla Loveless, MD 07/30/21 1501

## 2021-07-22 NOTE — ED Triage Notes (Signed)
Pt reports having back surgery on Friday. He reports the wound looks infected.

## 2021-07-22 NOTE — ED Provider Notes (Signed)
[facility]    CSN: [id number] Arrival date & time: 07/22/21  1227      History   Chief Complaint Chief Complaint  Patient presents with   Back Pain   Wound Check    HPI Manuel [name] is a 61 y.o. male presenting for wound check. History diabetes, recent cervical spinal surgery 07/16/21 - Posterior cervical fusion with lateral mass fixation - C3-C7, cervical laminectomy  - C3-C6.  Wife states that they were told to wait until Friday 6/30 to change the dressing.  They forgot to check the dressing until 07/21/2021, and became concerned with the amount of blood.  They presented to the urgent care today with concern for infection.  The patient states that the pain is increasing despite oxycodone taken as directed.  He denies new weakness or sensation change in the arms of the legs.  Denies urinary symptoms like retention.  Denies constipation  HPI  Past Medical History:  Diagnosis Date   Arthritis    Asthma    CHF (congestive heart failure) (HCC)    Diabetes mellitus without complication (HCC)    Dyspnea    GERD (gastroesophageal reflux disease)    HLD (hyperlipidemia)    Hypertension    Pneumonia    1981   Seasonal allergies     Patient Active Problem List   Diagnosis Date Noted   S/P cervical spinal fusion 07/16/2021    Past Surgical History:  Procedure Laterality Date   CARDIOVASCULAR STRESS TEST  07/31/2020   Nuclear stress test 07/31/20 [facility]): Negative study for ischemia or scar at 96% maximal heart rate predicted by age.  Overall gated LV systolic function was normal, calculated EF 64% at stress and at rest.  No LV dilatation.   COLONOSCOPY     left arm surgery     POSTERIOR CERVICAL FUSION/FORAMINOTOMY N/A 07/16/2021   Procedure: Posterior cervical fusion with lateral mass fixation - Cervical Three-Cervical Seven,, cervical laminectomy  - Cervical Three-Cevical Six;  Surgeon: Tia Alert, MD;  Location: [facility] OR;  Service: Neurosurgery;   Laterality: N/A;       Home Medications    Prior to Admission medications   Medication Sig Start Date End Date Taking? Authorizing Provider  albuterol (VENTOLIN HFA) 108 (90 Base) MCG/ACT inhaler Inhale 2 puffs into the lungs every 6 (six) hours as needed for wheezing or shortness of breath.    [provider]  amLODipine (NORVASC) 10 MG tablet Take 10 mg by mouth daily.    [provider]  atorvastatin (LIPITOR) 40 MG tablet Take 40 mg by mouth daily.    [provider]  cetirizine (ZYRTEC) 10 MG tablet Take 10 mg by mouth at bedtime.    [provider]  cholecalciferol (VITAMIN D3) 25 MCG (1000 UNIT) tablet Take 1,000 Units by mouth daily.    [provider]  cyclobenzaprine (FLEXERIL) 10 MG tablet Take 1 tablet (10 mg total) by mouth 3 (three) times daily as needed for muscle spasms. 07/17/21   Tia Alert, MD  empagliflozin (JARDIANCE) 25 MG TABS tablet Take 12.5 mg by mouth daily.    [provider]  famotidine (PEPCID) 20 MG tablet Take 20 mg by mouth daily as needed for heartburn or indigestion.    [provider]  fluticasone (FLONASE) 50 MCG/ACT nasal spray Place 2 sprays into both nostrils daily as needed for allergies or rhinitis.    [provider]  gabapentin (NEURONTIN) 100 MG capsule Take 100  mg by mouth 3 (three) times daily as needed (pain).    [provider]  losartan (COZAAR) 50 MG tablet Take 50 mg by mouth daily.    [provider]  meloxicam (MOBIC) 15 MG tablet Take 15 mg by mouth daily as needed for pain.    [provider]  metFORMIN (GLUCOPHAGE-XR) 500 MG 24 hr tablet Take 500 mg by mouth 2 (two) times daily.    [provider]  metoprolol tartrate (LOPRESSOR) 25 MG tablet Take 12.5 mg by mouth 2 (two) times daily.    [provider]  Mometasone Furoate [facility] HFA) 100 MCG/ACT AERO Inhale 2 puffs into the lungs at bedtime.    [provider]  montelukast (SINGULAIR) 10 MG tablet Take 10 mg by mouth at bedtime.    [provider]  oxyCODONE 10 MG TABS Take 1 tablet (10 mg total) by mouth every 4 (four) hours as needed for severe pain ((score 7 to 10)). 07/17/21   Tia Alert, MD  spironolactone (ALDACTONE) 25 MG tablet Take 12.5 mg by mouth daily.    [provider]  Tiotropium Bromide-Olodaterol (STIOLTO RESPIMAT) 2.5-2.5 MCG/ACT AERS Inhale 2 each into the lungs daily.    [provider]    Family History No family history on file.  Social History Social History   Tobacco Use   Smoking status: Never   Smokeless tobacco: Never  Vaping Use   Vaping Use: Never used  Substance Use Topics   Alcohol use: Never   Drug use: Never     Allergies   Vasotec [enalapril maleate]   Review of Systems Review of Systems  Skin:  Positive for wound.  All other systems reviewed and are negative.    Physical Exam Triage Vital Signs ED Triage Vitals [07/22/21 1246]  Enc Vitals Group     BP 140/87     Pulse Rate (!) 123     Resp 16     Temp 98 F (36.7 C)     Temp Source Oral     SpO2 98 %     Weight      Height      Head Circumference      Peak Flow      Pain Score      Pain Loc      Pain Edu?      Excl. in GC?    No data found.  Updated Vital Signs BP 140/87 (BP Location: Left Arm)   Pulse (!) 123   Temp 98 F (36.7 C) (Oral)   Resp 16   SpO2 98%   Visual Acuity Right Eye Distance:   Left Eye Distance:   Bilateral Distance:    Right Eye Near:   Left Eye Near:    Bilateral Near:     Physical Exam Vitals reviewed.  Constitutional:      General: He is not in acute distress.    Appearance: Normal appearance. He is not ill-appearing.  HENT:     Head: Normocephalic and atraumatic.  Cardiovascular:     Rate and Rhythm: Tachycardia present.  Pulmonary:     Effort: Pulmonary effort is normal.  Skin:    Comments: See image below Surgical incision overlying cervical  spine. There are multiple missing sutures and bloody drainage. There is mild induration surrounding the incision site. No fluctuance or purulent drainage.   Neurological:     General: No focal deficit present.     Mental Status:  He is alert and oriented to person, place, and time.     Comments: PERRLA, EOMI. Strength and sensation grossly intact upper and lower extremities. No saddle anesthesia.   Psychiatric:        Mood and Affect: Mood normal.        Behavior: Behavior normal.        Thought Content: Thought content normal.        Judgment: Judgment normal.        UC Treatments / Results  Labs (all labs ordered are listed, but only abnormal results are displayed) Labs Reviewed - No data to display  EKG   Radiology No results found.  Procedures Procedures (including critical care time)  Medications Ordered in UC Medications - No data to display  Initial Impression / Assessment and Plan / UC Course  I have reviewed the triage vital signs and the nursing notes.  Pertinent labs & imaging results that were available during my care of the patient were reviewed by me and considered in my medical decision making (see chart for details).     This patient is a very pleasant 61 y.o. year old male presenting with possible cervical spinal infection following surgery 07/16/21. Afebrile but tachy at 123, and he remained tachy at 120 on recheck.   This patient recently underwent cervical spinal surgery on 07/16/21 - Posterior cervical fusion with lateral mass fixation - C3-C7, cervical laminectomy - C3-C6.  I initially evaluated the patient myself, and became concerned for infection given the tachycardia, increasing pain and bloody drainage. I then evaluated the patient with attending physician Dr. Marlinda Mike. Following discussion with attending physician, patient and spouse, we will proceed with ED evaluation. The patient was wheeled to car by RN. He does not appear to have any new  neurological deficients or weakness in the arms or legs, and is hemodynamically stable for transport to ED via POV driven by wife. Wife was present during the entire visit.  Level 5 as he was admitted.   Final Clinical Impressions(s) / UC Diagnoses   Final diagnoses:  Encounter for post surgical wound check  History of cervical spinal surgery     Discharge Instructions      -Please head to the ED. We're concerned you have an infection of your spinal surgical wound, and this could get really serious quickly without proper intervention. Please head straight there.    ED Prescriptions   None    PDMP not reviewed this encounter.   Rhys Martini, PA-C 07/22/21 1328    Rhys Martini, PA-C 07/24/21 [phone]

## 2021-07-22 NOTE — ED Provider Notes (Signed)
Signout from Loeffler PA-C at shift change. Briefly, patient presents for back pain, increasing drainage from postoperative wound.  Patient had posterior cervical fusion performed on 6/26.  Patient has had increasing pain around the wound site as well as radiation into the bilateral shoulders and arms.   Plan: MRI pending   6:11 PM Reassessment performed. Patient appears mildly uncomfortable.  Wound examined.  There is bloody drainage from the inferior aspect of the wound.  Patient is able to sit forward but appears uncomfortable while doing so.  He remains tachycardic, upper 110's.  He denies chest pain or shortness of breath.  Labs and imaging personally reviewed and interpreted including: MRI of the cervical spine, fluid collection noted.    Reviewed additional pertinent lab work and imaging with patient at bedside including: CBC with diff unremarkable; BMP mild hyponatremia 133, mild hypokalemia 3.4, mildly low chloride 95 with glucose 203 with normal anion gap; lactate 1.4.   Most current vital signs reviewed and are as follows: BP 117/89   Pulse (!) 118   Temp 98 F (36.7 C) (Oral)   Resp 18   SpO2 97%   Plan: Consulted with neurosurgery APP Julien Girt who will see patient in the emergency department.  7:16 PM Discussed with Meribeth Mattes. Neurosurgery plan to admit.       Renne Crigler, PA-C 07/22/21 1916    Lorre Nick, MD 07/26/21 [phone]

## 2021-07-22 NOTE — H&P (Signed)
Chief Complaint: Upper back/BUE pain, wound drainage HPI: Manuel [name] is a 61 y.o. male who is s/p posterior cervical decompression and instrumented fusion C3-C7 with Dr. Yetta Barre on 07/16/2021. The patient recovered from his surgery well and was discharge in good condition on 07/17/2021. He initially obtained relief of his radicular symptoms following surgery and had appropriate incisional pain. A few days after he was discharged, he began to have progressively worsening upper back/neck pain that radiated into his triceps bilaterally. The pain has continued to worsen and has recently noted difficulty with balance and mild gait abnormalities. His wound has serosanguinous drainage that has persisted over the last few days as well. While in the ED, he has been tachycardic. He denies weakness, fever, chest pain and shortness of breath.  Past Medical History:  Diagnosis Date   Arthritis    Asthma    CHF (congestive heart failure) (HCC)    Diabetes mellitus without complication (HCC)    Dyspnea    GERD (gastroesophageal reflux disease)    HLD (hyperlipidemia)    Hypertension    Pneumonia    1981   Seasonal allergies     Past Surgical History:  Procedure Laterality Date   CARDIOVASCULAR STRESS TEST  07/31/2020   Nuclear stress test 07/31/20 [facility]): Negative study for ischemia or scar at 96% maximal heart rate predicted by age.  Overall gated LV systolic function was normal, calculated EF 64% at stress and at rest.  No LV dilatation.   COLONOSCOPY     left arm surgery     POSTERIOR CERVICAL FUSION/FORAMINOTOMY N/A 07/16/2021   Procedure: Posterior cervical fusion with lateral mass fixation - Cervical Three-Cervical Seven,, cervical laminectomy  - Cervical Three-Cevical Six;  Surgeon: Tia Alert, MD;  Location: [facility] OR;  Service: Neurosurgery;  Laterality: N/A;    History reviewed. No pertinent family history. Social History:  reports that he has never smoked. He has never used smokeless  tobacco. He reports that he does not drink alcohol and does not use drugs.  Allergies:  Allergies  Allergen Reactions   Vasotec [Enalapril Maleate] Itching and Tinitus    (Not in a hospital admission)   Results for orders placed or performed during the hospital encounter of 07/22/21 (from the past 48 hour(s))  Lactic acid, plasma     Status: None   Collection Time: 07/22/21  3:15 PM  Result Value Ref Range   Lactic Acid, Venous 1.4 0.5 - 1.9 mmol/L    Comment: Performed at [facility], [address] [place], [place] [address]  CBC with Differential     Status: None   Collection Time: 07/22/21  3:21 PM  Result Value Ref Range   WBC 7.1 4.0 - 10.5 K/uL   RBC 4.95 4.22 - 5.81 MIL/uL   Hemoglobin 13.3 13.0 - 17.0 g/dL   HCT 54.6 50.3 - 54.6 %   MCV 84.6 80.0 - 100.0 fL   MCH 26.9 26.0 - 34.0 pg   MCHC 31.7 30.0 - 36.0 g/dL   RDW 56.8 12.7 - 51.7 %   Platelets 220 150 - 400 K/uL   nRBC 0.0 0.0 - 0.2 %   Neutrophils Relative % 67 %   Neutro Abs 4.8 1.7 - 7.7 K/uL   Lymphocytes Relative 19 %   Lymphs Abs 1.3 0.7 - 4.0 K/uL   Monocytes Relative 12 %   Monocytes Absolute 0.8 0.1 - 1.0 K/uL   Eosinophils Relative 2 %   Eosinophils Absolute 0.1  0.0 - 0.5 K/uL   Basophils Relative 0 %   Basophils Absolute 0.0 0.0 - 0.1 K/uL   Immature Granulocytes 0 %   Abs Immature Granulocytes 0.02 0.00 - 0.07 K/uL    Comment: Performed at [facility], [address] [place], [place] [address]  Basic metabolic panel     Status: Abnormal   Collection Time: 07/22/21  3:21 PM  Result Value Ref Range   Sodium 133 (L) 135 - 145 mmol/L   Potassium 3.4 (L) 3.5 - 5.1 mmol/L   Chloride 95 (L) 98 - 111 mmol/L   CO2 25 22 - 32 mmol/L   Glucose, Bld 203 (H) 70 - 99 mg/dL    Comment: Glucose reference range applies only to samples taken after fasting for at least 8 hours.   BUN 17 6 - 20 mg/dL   Creatinine, Ser 1.91 0.61 - 1.24 mg/dL   Calcium 9.2 8.9 - 47.8 mg/dL   GFR, Estimated >29  >56 mL/min    Comment: (NOTE) Calculated using the CKD-EPI Creatinine Equation (2021)    Anion gap 13 5 - 15    Comment: Performed at [facility], [address], [place], [place] [address]   MR Cervical Spine W or Wo Contrast  Result Date: 07/22/2021 CLINICAL DATA:  Worsening upper back pain. History of cervical spine surgery on 07/16/2021. Bilateral upper extremity paresthesias EXAM: MRI CERVICAL SPINE WITHOUT AND WITH CONTRAST TECHNIQUE: Multiplanar and multiecho pulse sequences of the cervical spine, to include the craniocervical junction and cervicothoracic junction, were obtained without and with intravenous contrast. CONTRAST:  13mL GADAVIST GADOBUTROL 1 MMOL/ML IV SOLN COMPARISON:  MRI 06/27/2021 FINDINGS: Alignment: Physiologic. Vertebrae: Interval postsurgical changes of recent C3-C7 bilateral posterior cervical fusion and wide posterior decompression. No acute fracture. No evidence of discitis. No suspicious marrow replacing bone lesion. Cord: No focal cord signal abnormality. There is posterior epidural enhancement at the surgical sites without a well-defined fluid collection in the epidural space. Posterior Fossa, vertebral arteries, paraspinal tissues: Postoperative fluid collection in the laminectomy bed of the mid to lower cervical spine with maximal measurements of 6.0 x 2.2 cm transaxially and extending 10.0 cm in craniocaudal dimension. Numerous foci of susceptibility artifact within the collection may represent a component of residual postoperative air. Thin peripheral enhancement on postcontrast sequences. Remaining paraspinal soft tissues appear within normal limits. Vertebral artery flow voids are preserved. Disc levels: C2-C3: No disc protrusion. Minimal facet and uncovertebral spurring contribute to mild bilateral foraminal stenosis. Mild canal stenosis, predominantly on the basis of a congenitally narrow canal. Unchanged. C3-C4: Posterior decompression. Small disc osteophyte  complex with bilateral facet and uncovertebral arthropathy. Moderate bilateral foraminal stenosis with moderate canal stenosis. Improved. C4-C5: Posterior decompression. Mild disc osteophyte complex with bilateral facet and uncovertebral spurring. Moderate bilateral foraminal stenosis with moderate canal stenosis. Improved. C5-C6: Posterior decompression. Disc osteophyte complex with bilateral facet and uncovertebral arthropathy. Moderate to severe bilateral foraminal stenosis with moderate canal stenosis. Improved. C6-C7: Posterior decompression. Disc osteophyte complex with bilateral facet and uncovertebral arthropathy. Severe bilateral foraminal stenosis with moderate canal stenosis. Improved. C7-T1: Minimal disc bulge with right greater than left uncovertebral spurring and mild facet arthropathy. Severe right and mild left foraminal stenosis. No significant canal stenosis. Unchanged. IMPRESSION: 1. Interval postsurgical changes of recent C3-C7 posterior cervical fusion and decompression. Postoperative fluid collection in the laminectomy bed of the mid to lower cervical spine, measuring up to 6.0 x 2.2 x 10.0 cm. Appearance favors an expected  postoperative seroma/hematoma in the recent postop setting. A superimposed infection would be difficult to exclude. 2. Moderate canal stenosis from C3-4 through C6-7, improved following posterior decompression. 3. Multilevel foraminal stenosis, severe bilaterally at C6-7 and on the right at C7-T1, unchanged in degree from the previous MRI. Electronically Signed   By: Duanne Guess D.O.   On: 07/22/2021 17:50    ROS: Per HPI  Blood pressure 115/86, pulse (!) 116, temperature 98 F (36.7 C), temperature source Oral, resp. rate (!) 25, SpO2 97 %. Physical Exam: General Appearance: Alert, cooperative, mild distress, appears stated age Head: Normocephalic, without obvious abnormality, atraumatic Throat: benign Neck: Supple, symmetrical, trachea midline, PCDF wound  with serosanguineous drainage Lungs: Clear to auscultation bilaterally, respirations unlabored Heart: Tachycardic Abdomen: Soft Extremities: Extremities normal, atraumatic Pulses: 2+ and symmetric all extremities Neurologic: Patient is awake, A/O X 4, and conversant. He is in mild distress. Speech is fluent and appropriate. MAEW with good strength that is symmetric bilaterally.  BUE 5/5 throughout, BLE 5/5 throughout. Reflexes symmetric, no pathologic reflexes, No Hoffman's, No clonus, plantars downgoing bilaterally. Sensation to light touch is intact. PERLA, EOMI. CNs grossly intact. Dressing is mildly saturated with serosanguineous drainage. Wound intact with drainage from inferior portion.     Assessment/Plan 61 y.o. male who is s/p C3-C7 PCDF on 07/16/2021 who has developed severe and intractable upper back and neck pain that radiates into his bilateral triceps. AN MRI was obtained and revealed a fluid collection that is likely the normal expected postoperative evolution. I would have expected to see more CSF signal around his spinal cord in the decompressed areas. His wound has had increasing drainage over the last few days and is concerning for possible infection. Since the patient is having such severe and intractable pain with reports of balance difficulties, I felt it was necessary for him to be admitted for monitoring as he could be high risk for neurological compromise. We will begin him on Keflex 500 mg QID for his persistent drainage. Pain control. PT/OT. Dr. Yetta Barre to see the patient in the morning. Will make NPO at midnight in case a need for surgery arises.   Council Mechanic, DNP, AGNP-C Neurosurgery Nurse Practitioner  [facility] [address], [address], [place], [place] [address] P: [phone]    F: [phone]  07/22/2021 8:31 PM

## 2021-07-22 NOTE — ED Notes (Signed)
Pt provided cup of ice

## 2021-07-22 NOTE — ED Triage Notes (Signed)
Patient complains of pain related to back surgery this past Wednesday, reports pain increased with any movement. Denies fever, no trauma

## 2021-07-23 LAB — CBG MONITORING, ED
Glucose-Capillary: 222 mg/dL — ABNORMAL HIGH (ref 70–99)
Glucose-Capillary: 179 mg/dL — ABNORMAL HIGH (ref 70–99)

## 2021-07-23 LAB — C-REACTIVE PROTEIN: CRP: 10.6 mg/dL — ABNORMAL HIGH (ref <1.0)

## 2021-07-23 LAB — SEDIMENTATION RATE: Sed Rate: 47 mm/hr — ABNORMAL HIGH (ref 0–16)

## 2021-07-23 MED ORDER — LIVING WELL WITH DIABETES BOOK
Freq: Once | Status: AC
Start: 2021-07-23 — End: 2021-07-23
  Filled 2021-07-23: qty 1

## 2021-07-23 MED ORDER — INSULIN ASPART 100 UNIT/ML IJ SOLN
0.0000 [IU] | Freq: Three times a day (TID) | INTRAMUSCULAR | Status: DC
  Administered 2021-07-23: 3 [IU] via SUBCUTANEOUS

## 2021-07-23 MED ORDER — OXYCODONE HCL 10 MG PO TABS: 10.0000 mg | ORAL_TABLET | ORAL | 0 refills | Status: AC | PRN

## 2021-07-23 MED ORDER — CEPHALEXIN 500 MG PO CAPS: 500.0000 mg | ORAL_CAPSULE | Freq: Four times a day (QID) | ORAL | 0 refills | Status: AC

## 2021-07-23 NOTE — Progress Notes (Addendum)
Inpatient Diabetes Program Recommendations  AACE/ADA: New Consensus Statement on Inpatient Glycemic Control (2015)  Target Ranges:  Prepandial:   less than 140 mg/dL      Peak postprandial:   less than 180 mg/dL (1-2 hours)      Critically ill patients:  140 - 180 mg/dL   Lab Results  Component Value Date   GLUCAP 194 (H) 07/17/2021   HGBA1C 10.2 (H) 07/16/2021    Review of Glycemic Control  Latest Reference Range & Units 07/22/21 15:21  Glucose 70 - 99 mg/dL 295 (H)   Diabetes history: DM 2 Outpatient Diabetes medications:  Jardiance 12.5 mg daily Metformin 500 mg bid Current orders for Inpatient glycemic control:  None Inpatient Diabetes Program Recommendations:   Please add Novolog moderate correction q 4 hours.  Thanks,  Beryl Meager, RN, BC-ADM Inpatient Diabetes Coordinator Pager [phone]  (8a-5p)  Addendum:  Spoke with patient regarding A1C of 10.2%.  Patient states that he was on steroids prior to surgery and he thinks this contributed to elevated blood sugars.  He states that blood sugar are better recently.  We discussed the importance of glycemic control for post-op healing with goal blood sugars of 80-130 mg/dL and PP <469 mg/dL.  Encouraged close f/u with PCP as well for DM management.  Will order LWWD booklet for patient.  Patient appreciative of visit.  Will follow.

## 2021-07-23 NOTE — Discharge Summary (Signed)
Physician Discharge Summary  Patient ID: Manuel [name] MRN: [id number] DOB/AGE: Jun 05, 1960 61 y.o.  Admit date: 07/22/2021 Discharge date: 07/23/2021  Admission Diagnoses: wound drainage    Discharge Diagnoses: same   Discharged Condition: good  Hospital Course: The patient was admitted on 07/22/2021 with neck pain and wound drainage 1 week after CL/ PCF C3-7. MRI showed no obvious infection, though there was the expected post-op fluid collection. SED rate 47 and CRP 10, will serve as a baseline and are a little high but this can be seen this soon after major surgery. WBC 7.7 and down to 7.1 this am. Afrebrile. He states he feels much better. Did well with PT. The hospital course was routine. There were no complications. There was some serosanguinous drainage, but no pus or redness or induration. This stopped with staples. New dressing placed.  wound then remained clean dry and intact. Pt had appropriate neck soreness. No complaints of arm pain or new N/T/W. The patient remained afebrile with stable vital signs, and tolerated a regular diet. The patient continued to increase activities, and pain was well controlled with oral pain medications. He will be discharged on keflex 500 qid for 10 days  Consults: None  Significant Diagnostic Studies:  Results for orders placed or performed during the hospital encounter of 07/22/21  CBC with Differential  Result Value Ref Range   WBC 7.1 4.0 - 10.5 K/uL   RBC 4.95 4.22 - 5.81 MIL/uL   Hemoglobin 13.3 13.0 - 17.0 g/dL   HCT 44.6 28.6 - 38.1 %   MCV 84.6 80.0 - 100.0 fL   MCH 26.9 26.0 - 34.0 pg   MCHC 31.7 30.0 - 36.0 g/dL   RDW 77.1 16.5 - 79.0 %   Platelets 220 150 - 400 K/uL   nRBC 0.0 0.0 - 0.2 %   Neutrophils Relative % 67 %   Neutro Abs 4.8 1.7 - 7.7 K/uL   Lymphocytes Relative 19 %   Lymphs Abs 1.3 0.7 - 4.0 K/uL   Monocytes Relative 12 %   Monocytes Absolute 0.8 0.1 - 1.0 K/uL   Eosinophils Relative 2 %   Eosinophils  Absolute 0.1 0.0 - 0.5 K/uL   Basophils Relative 0 %   Basophils Absolute 0.0 0.0 - 0.1 K/uL   Immature Granulocytes 0 %   Abs Immature Granulocytes 0.02 0.00 - 0.07 K/uL  Basic metabolic panel  Result Value Ref Range   Sodium 133 (L) 135 - 145 mmol/L   Potassium 3.4 (L) 3.5 - 5.1 mmol/L   Chloride 95 (L) 98 - 111 mmol/L   CO2 25 22 - 32 mmol/L   Glucose, Bld 203 (H) 70 - 99 mg/dL   BUN 17 6 - 20 mg/dL   Creatinine, Ser 3.83 0.61 - 1.24 mg/dL   Calcium 9.2 8.9 - 33.8 mg/dL   GFR, Estimated >32 >91 mL/min   Anion gap 13 5 - 15  Lactic acid, plasma  Result Value Ref Range   Lactic Acid, Venous 1.4 0.5 - 1.9 mmol/L  Sedimentation rate  Result Value Ref Range   Sed Rate 47 (H) 0 - 16 mm/hr  C-reactive protein  Result Value Ref Range   CRP 10.6 (H) <1.0 mg/dL  CBG monitoring, ED  Result Value Ref Range   Glucose-Capillary 222 (H) 70 - 99 mg/dL  CBG monitoring, ED  Result Value Ref Range   Glucose-Capillary 179 (H) 70 - 99 mg/dL    MR Cervical Spine W or Wo Contrast  Result Date: 07/22/2021 CLINICAL DATA:  Worsening upper back pain. History of cervical spine surgery on 07/16/2021. Bilateral upper extremity paresthesias EXAM: MRI CERVICAL SPINE WITHOUT AND WITH CONTRAST TECHNIQUE: Multiplanar and multiecho pulse sequences of the cervical spine, to include the craniocervical junction and cervicothoracic junction, were obtained without and with intravenous contrast. CONTRAST:  75mL GADAVIST GADOBUTROL 1 MMOL/ML IV SOLN COMPARISON:  MRI 06/27/2021 FINDINGS: Alignment: Physiologic. Vertebrae: Interval postsurgical changes of recent C3-C7 bilateral posterior cervical fusion and wide posterior decompression. No acute fracture. No evidence of discitis. No suspicious marrow replacing bone lesion. Cord: No focal cord signal abnormality. There is posterior epidural enhancement at the surgical sites without a well-defined fluid collection in the epidural space. Posterior Fossa, vertebral arteries,  paraspinal tissues: Postoperative fluid collection in the laminectomy bed of the mid to lower cervical spine with maximal measurements of 6.0 x 2.2 cm transaxially and extending 10.0 cm in craniocaudal dimension. Numerous foci of susceptibility artifact within the collection may represent a component of residual postoperative air. Thin peripheral enhancement on postcontrast sequences. Remaining paraspinal soft tissues appear within normal limits. Vertebral artery flow voids are preserved. Disc levels: C2-C3: No disc protrusion. Minimal facet and uncovertebral spurring contribute to mild bilateral foraminal stenosis. Mild canal stenosis, predominantly on the basis of a congenitally narrow canal. Unchanged. C3-C4: Posterior decompression. Small disc osteophyte complex with bilateral facet and uncovertebral arthropathy. Moderate bilateral foraminal stenosis with moderate canal stenosis. Improved. C4-C5: Posterior decompression. Mild disc osteophyte complex with bilateral facet and uncovertebral spurring. Moderate bilateral foraminal stenosis with moderate canal stenosis. Improved. C5-C6: Posterior decompression. Disc osteophyte complex with bilateral facet and uncovertebral arthropathy. Moderate to severe bilateral foraminal stenosis with moderate canal stenosis. Improved. C6-C7: Posterior decompression. Disc osteophyte complex with bilateral facet and uncovertebral arthropathy. Severe bilateral foraminal stenosis with moderate canal stenosis. Improved. C7-T1: Minimal disc bulge with right greater than left uncovertebral spurring and mild facet arthropathy. Severe right and mild left foraminal stenosis. No significant canal stenosis. Unchanged. IMPRESSION: 1. Interval postsurgical changes of recent C3-C7 posterior cervical fusion and decompression. Postoperative fluid collection in the laminectomy bed of the mid to lower cervical spine, measuring up to 6.0 x 2.2 x 10.0 cm. Appearance favors an expected postoperative  seroma/hematoma in the recent postop setting. A superimposed infection would be difficult to exclude. 2. Moderate canal stenosis from C3-4 through C6-7, improved following posterior decompression. 3. Multilevel foraminal stenosis, severe bilaterally at C6-7 and on the right at C7-T1, unchanged in degree from the previous MRI. Electronically Signed   By: Duanne Guess D.O.   On: 07/22/2021 17:50   DG Cervical Spine Complete  Result Date: 07/16/2021 CLINICAL DATA:  Cervical fusion from C3 through C7 EXAM: CERVICAL SPINE - COMPLETE 4+ VIEW COMPARISON:  None Available. FINDINGS: Multiple intraoperative fluoroscopic spot images are provided. Posterior cervical fusion and decompression from C3 through C7. FLUOROSCOPY TIME:  Radiation Exposure Index: 10.26 mGy IMPRESSION: 1. Posterior cervical fusion and decompression from C3 through C7. Electronically Signed   By: Elige Ko M.D.   On: 07/16/2021 13:33   DG C-Arm 1-60 Min-No Report  Result Date: 07/16/2021 Fluoroscopy was utilized by the requesting physician.  No radiographic interpretation.   DG C-Arm 1-60 Min-No Report  Result Date: 07/16/2021 Fluoroscopy was utilized by the requesting physician.  No radiographic interpretation.    Antibiotics:  Anti-infectives (From admission, onward)    Start     Dose/Rate Route Frequency Ordered Stop   07/23/21 0000  cephALEXin (KEFLEX) 500 MG capsule  500 mg Oral Every 6 hours 07/23/21 1659     07/22/21 2100  cephALEXin (KEFLEX) capsule 500 mg        500 mg Oral Every 6 hours 07/22/21 2017         Discharge Exam: Blood pressure 120/83, pulse (!) 103, temperature 98.5 F (36.9 C), temperature source Oral, resp. rate 16, SpO2 95 %. Neurologic: Grossly normal Dressing dry  Discharge Medications:   Allergies as of 07/23/2021       Reactions   Vasotec [enalapril Maleate] Itching, Tinitus        Medication List     TAKE these medications    albuterol 108 (90 Base) MCG/ACT  inhaler Commonly known as: VENTOLIN HFA Inhale 2 puffs into the lungs every 6 (six) hours as needed for wheezing or shortness of breath.   amLODipine 10 MG tablet Commonly known as: NORVASC Take 10 mg by mouth daily.   Asmanex HFA 100 MCG/ACT Aero Generic drug: Mometasone Furoate Inhale 2 puffs into the lungs at bedtime.   atorvastatin 40 MG tablet Commonly known as: LIPITOR Take 40 mg by mouth daily.   cephALEXin 500 MG capsule Commonly known as: KEFLEX Take 1 capsule (500 mg total) by mouth every 6 (six) hours.   cetirizine 10 MG tablet Commonly known as: ZYRTEC Take 10 mg by mouth at bedtime.   cholecalciferol 25 MCG (1000 UNIT) tablet Commonly known as: VITAMIN D3 Take 1,000 Units by mouth daily.   cyclobenzaprine 10 MG tablet Commonly known as: FLEXERIL Take 1 tablet (10 mg total) by mouth 3 (three) times daily as needed for muscle spasms.   empagliflozin 25 MG Tabs tablet Commonly known as: JARDIANCE Take 12.5 mg by mouth daily.   famotidine 20 MG tablet Commonly known as: PEPCID Take 20 mg by mouth daily as needed for heartburn or indigestion.   fluticasone 50 MCG/ACT nasal spray Commonly known as: FLONASE Place 2 sprays into both nostrils daily as needed for allergies or rhinitis.   gabapentin 100 MG capsule Commonly known as: NEURONTIN Take 100 mg by mouth 3 (three) times daily as needed (pain).   losartan 50 MG tablet Commonly known as: COZAAR Take 50 mg by mouth daily.   meloxicam 15 MG tablet Commonly known as: MOBIC Take 15 mg by mouth daily as needed for pain.   metFORMIN 500 MG 24 hr tablet Commonly known as: GLUCOPHAGE-XR Take 500 mg by mouth 2 (two) times daily.   metoprolol tartrate 25 MG tablet Commonly known as: LOPRESSOR Take 12.5 mg by mouth 2 (two) times daily.   montelukast 10 MG tablet Commonly known as: SINGULAIR Take 10 mg by mouth at bedtime.   Oxycodone HCl 10 MG Tabs Take 1 tablet (10 mg total) by mouth every 4 (four)  hours as needed ((score 7 to 10)). What changed: reasons to take this   spironolactone 25 MG tablet Commonly known as: ALDACTONE Take 12.5 mg by mouth daily.   Stiolto Respimat 2.5-2.5 MCG/ACT Aers Generic drug: Tiotropium Bromide-Olodaterol Inhale 2 each into the lungs daily.        Disposition: home   Final Dx: serosanguinous wound drainage  Discharge Instructions      Remove dressing in 72 hours   Complete by: As directed    Call MD for:  difficulty breathing, headache or visual disturbances   Complete by: As directed    Call MD for:  persistant nausea and vomiting   Complete by: As directed    Call MD for:  redness, tenderness, or signs of infection (pain, swelling, redness, odor or green/yellow discharge around incision site)   Complete by: As directed    Call MD for:  severe uncontrolled pain   Complete by: As directed    Call MD for:  temperature >100.4   Complete by: As directed    Diet - low sodium heart healthy   Complete by: As directed    Increase activity slowly   Complete by: As directed           Signed: Tia Alert 07/23/2021, 5:05 PM

## 2021-07-23 NOTE — Evaluation (Signed)
Occupational Therapy Evaluation Patient Details Name: Manuel [name] MRN: [id number] DOB: Nov 17, 1960 Today's Date: 07/23/2021   History of Present Illness Manuel [name] is a 61 y.o. male presenting for wound check. History diabetes, recent cervical spinal surgery 07/16/21 - Posterior cervical fusion with lateral mass fixation C3-C7, cervical laminectomy  - C3-C6. They presented to the urgent care today with concern for infection.   Clinical Impression   Pt presents at a supervision level for simulated selfcare, transfers, and functional mobility with use of his cane.  He maintains neck flexion in sitting and standing and my benefit from a cervical collar to help increase extension to neutral to assist with this. Feel he will benefit from acute care OT to progress back to modified independent level for home with his spouse.  Do not anticipate any post acute OT needs.        Recommendations for follow up therapy are one component of a multi-disciplinary discharge planning process, led by the attending physician.  Recommendations may be updated based on patient status, additional functional criteria and insurance authorization.   Follow Up Recommendations  No OT follow up    Assistance Recommended at Discharge PRN  Patient can return home with the following Assistance with cooking/housework;Assist for transportation;Direct supervision/assist for medications management    Functional Status Assessment  Patient has had a recent decline in their functional status and demonstrates the ability to make significant improvements in function in a reasonable and predictable amount of time.  Equipment Recommendations  None recommended by OT       Precautions / Restrictions Precautions Precautions: Cervical Precaution Comments: No cervical collar Restrictions Weight Bearing Restrictions: No      Mobility Bed Mobility Overal bed mobility: Needs Assistance Bed Mobility: Supine  to Sit, Sit to Supine     Supine to sit: Supervision Sit to supine: Supervision   General bed mobility comments: Pt on stretcher    Transfers Overall transfer level: Needs assistance Equipment used: Straight cane Transfers: Sit to/from Stand Sit to Stand: Supervision           General transfer comment: Pt with flexed posturing in his neck during mobility with use of the cane.      Balance Overall balance assessment: Needs assistance Sitting-balance support: No upper extremity supported Sitting balance-Leahy Scale: Good     Standing balance support: Single extremity supported, During functional activity, Reliant on assistive device for balance Standing balance-Leahy Scale: Fair Standing balance comment: Pt needs use of a cane for support                           ADL either performed or assessed with clinical judgement   ADL Overall ADL's : Needs assistance/impaired Eating/Feeding: Independent;Sitting   Grooming: Supervision/safety;Standing Grooming Details (indicate cue type and reason): simulated Upper Body Bathing: Set up;Sitting Upper Body Bathing Details (indicate cue type and reason): simulated     Upper Body Dressing : Set up;Sitting Upper Body Dressing Details (indicate cue type and reason): simulated Lower Body Dressing: Supervision/safety Lower Body Dressing Details (indicate cue type and reason): sit to stand Toilet Transfer: Supervision/safety;Ambulation Toilet Transfer Details (indicate cue type and reason): simulated without an assistive device. Toileting- Clothing Manipulation and Hygiene: Supervision/safety;Sit to/from stand       Functional mobility during ADLs: Supervision/safety;Cane       Vision Baseline Vision/History: 0 No visual deficits Ability to See in Adequate Light: 0 Adequate Patient Visual Report: No change  from baseline Vision Assessment?: No apparent visual deficits     Perception Perception Perception: Within  Functional Limits   Praxis Praxis Praxis: Intact    Pertinent Vitals/Pain Pain Assessment Pain Assessment: 0-10 Pain Score: 2  Pain Location: neck Pain Descriptors / Indicators: Discomfort Pain Intervention(s): Limited activity within patient's tolerance, Repositioned     Hand Dominance Right   Extremity/Trunk Assessment Upper Extremity Assessment Upper Extremity Assessment: Generalized weakness LUE Deficits / Details: Grip strength 5/5 bilaterally,  No futher strength testing secondary to cervical precautions.       Cervical / Trunk Assessment Cervical / Trunk Assessment: Neck Surgery (cervical flexion at rest, no cervical collar)   Communication Communication Communication: No difficulties   Cognition Arousal/Alertness: Awake/alert Behavior During Therapy: WFL for tasks assessed/performed Overall Cognitive Status: Impaired/Different from baseline Area of Impairment: Memory                     Memory: Decreased short-term memory         General Comments: 1/3 words recalled                Home Living Family/patient expects to be discharged to:: Private residence Living Arrangements: Spouse/significant other Available Help at Discharge: Family;Available 24 hours/day Type of Home: House Home Access: Stairs to enter [employer] of Steps: 5-6 Entrance Stairs-Rails: Right;Left;Can reach both Home Layout: One level     Bathroom Shower/Tub: Producer, television/film/video: Standard     Home Equipment: Shower seat;Grab bars - tub/shower          Prior Functioning/Environment Prior Level of Function : Independent/Modified Independent             Mobility Comments: has been using the cane outside of the house since the surgery ADLs Comments: independent prior to original surgery        OT Problem List: Impaired balance (sitting and/or standing);Pain;Decreased knowledge of use of DME or AE      OT Treatment/Interventions:  Self-care/ADL training;Balance training;Therapeutic activities;DME and/or AE instruction;Patient/family education    OT Goals(Current goals can be found in the care plan section) Acute Rehab OT Goals Patient Stated Goal: Pt wants to get his pain improved OT Goal Formulation: With patient Time For Goal Achievement: 08/06/21 Potential to Achieve Goals: Good  OT Frequency: Min 2X/week       AM-PAC OT "6 Clicks" Daily Activity     Outcome Measure Help from another person eating meals?: None Help from another person taking care of personal grooming?: None Help from another person toileting, which includes using toliet, bedpan, or urinal?: A Little   Help from another person to put on and taking off regular upper body clothing?: None Help from another person to put on and taking off regular lower body clothing?: A Little 6 Click Score: 18   End of Session Nurse Communication: Mobility status  Activity Tolerance: Patient tolerated treatment well Patient left: in bed;with call bell/phone within reach;with family/visitor present  OT Visit Diagnosis: Unsteadiness on feet (R26.81)                Time: 7782-4235 OT Time Calculation (min): 23 min Charges:  OT General Charges $OT Visit: 1 Visit OT Evaluation $OT Eval Moderate Complexity: 1 Mod OT Treatments $Self Care/Home Management : 8-22 mins  Trequan Marsolek OTR/L 07/23/2021, 4:40 PM

## 2021-07-23 NOTE — ED Notes (Signed)
Pt dressed and walking with cane to wheelchair.

## 2021-07-23 NOTE — ED Notes (Signed)
Pt ambulated to the bathroom and back with cane. Mild - Moderate discomfort on return to room. States having generalized back pain to the neck and leg area. Pt appears to be in mild distress with rapid breathing and and facial grimacing.

## 2021-08-27 ENCOUNTER — Ambulatory Visit: Payer: Self-pay | Admitting: Urology

## 2021-09-10 ENCOUNTER — Ambulatory Visit: Payer: Self-pay | Admitting: Urology
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
## 2021-10-15 ENCOUNTER — Other Ambulatory Visit: Payer: BC Managed Care – PPO

## 2021-10-15 DIAGNOSIS — N5201 Erectile dysfunction due to arterial insufficiency: Secondary | ICD-10-CM

## 2021-10-16 LAB — PSA: Prostate Specific Ag, Serum: 0.5 ng/mL (ref 0.0–4.0)

## 2021-10-22 ENCOUNTER — Encounter: Payer: Self-pay | Admitting: Urology

## 2021-10-22 ENCOUNTER — Ambulatory Visit (INDEPENDENT_AMBULATORY_CARE_PROVIDER_SITE_OTHER): Payer: BC Managed Care – PPO | Admitting: Urology

## 2021-10-22 VITALS — BP 113/73 | HR 102

## 2021-10-22 DIAGNOSIS — R3915 Urgency of urination: Secondary | ICD-10-CM

## 2021-10-22 DIAGNOSIS — N5201 Erectile dysfunction due to arterial insufficiency: Secondary | ICD-10-CM

## 2021-10-22 MED ORDER — TADALAFIL 20 MG PO TABS: 20.0000 mg | ORAL_TABLET | Freq: Every day | ORAL | 3 refills | Status: AC | PRN

## 2021-10-22 NOTE — Progress Notes (Signed)
10/22/2021 9:24 AM   Jonathan Grant 1960/06/29 [id number]  Referring provider: Caren Macadam, MD [name],  [place] [address]  No chief complaint on file.   HPI:  F/u -    1) ED - Since June 2021. Tried sildenafil 02/22. It worked well, but he had some flushing with 100 mg although he was taking a 20 mg daily. His libido seems OK. Thinks his T might be low. They have children x 3. Labs from PCP with PSA only from 08/20 at 0.4. His has some fatigue and weight gain. His T was 397 in 2022.    2) LUTS - occasional urgency. Mentioned right back pain. No h/o stones. UA clear. No gross hematuria.   Today, seen for the above. His 2023 PSA is 0.5. He took sildenafil 20 mg but needed 5 pills. He felt some congestion or flushing. He then took [place] supplement from his brother which works well.     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    PMH: Past Medical History:  Diagnosis Date   Depression    Migraine     Surgical History: Past Surgical History:  Procedure Laterality Date   HERNIA REPAIR      Home Medications:  Allergies as of 10/22/2021   No Known Allergies      Medication List        Accurate as of October 22, 2021  9:24 AM. If you have any questions, ask your nurse or doctor.          Fish Oil 875 MG Caps 1 capsule   Magnesium 400 MG Caps 1 capsule   sildenafil 20 MG tablet Commonly known as: REVATIO Take 1 tablet (20 mg total) by mouth daily. Take 1-5 tablets as needed   venlafaxine XR 150 MG 24 hr capsule Commonly known as: EFFEXOR-XR Take 150 mg by mouth daily.   VITAMIN B 12 PO 1 tablet   Vitamin D 125 MCG (5000 UT) Caps 1 tablet        Allergies: No Known Allergies  Family History: Family History  Problem Relation Age of Onset   Dementia Mother    Schizophrenia Mother     Social History:  reports that he has never smoked. He has never used smokeless tobacco. He reports that he does not drink  alcohol and does not use drugs.   Physical Exam: BP 113/73   Pulse (!) 102   Constitutional:  Alert and oriented, No acute distress. HEENT: [place] AT, moist mucus membranes.  Trachea midline, no masses. Cardiovascular: No clubbing, cyanosis, or edema. Respiratory: Normal respiratory effort, no increased work of breathing. GI: Abdomen is soft, nontender, nondistended, no abdominal masses GU: No CVA tenderness Lymph: No cervical or inguinal lymphadenopathy. Skin: No rashes, bruises or suspicious lesions. Neurologic: Grossly intact, no focal deficits, moving all 4 extremities. Psychiatric: Normal mood and affect.  Laboratory Data: Lab Results  Component Value Date   WBC 6.3 10/03/2017   HGB 15.5 10/03/2017   HCT 44.3 10/03/2017   MCV 88.1 10/03/2017   PLT 229 10/03/2017    Lab Results  Component Value Date   CREATININE 1.05 10/03/2017    No results found for: "PSA"  Lab Results  Component Value Date   TESTOSTERONE 397 06/05/2020    Lab Results  Component Value Date   HGBA1C 5.2 10/03/2017    Urinalysis    Component Value Date/Time   COLORURINE YELLOW 10/03/2017 1006   APPEARANCEUR  Clear 06/05/2020 0912   LABSPEC 1.025 10/03/2017 1006   PHURINE 7.0 10/03/2017 1006   GLUCOSEU Negative 06/05/2020 0912   HGBUR NEGATIVE 10/03/2017 1006   BILIRUBINUR Negative 06/05/2020 0912   KETONESUR NEGATIVE 10/03/2017 1006   PROTEINUR Negative 06/05/2020 0912   PROTEINUR NEGATIVE 10/03/2017 1006   UROBILINOGEN 0.2 06/22/2008 1411   NITRITE Negative 06/05/2020 0912   NITRITE NEGATIVE 10/03/2017 1006   LEUKOCYTESUR Negative 06/05/2020 0912    Lab Results  Component Value Date   LABMICR See below: 06/05/2020   WBCUA None seen 06/05/2020   LABEPIT None seen 06/05/2020   BACTERIA None seen 06/05/2020    Pertinent Imaging: N/a    Assessment & Plan:    1. Urinary urgency stable - Urinalysis, Routine w reflex microscopic  2. ED - discussed to be careful about  supplements as they can raise blood pressure specifically yohimbine and to avoid it. We also discussed the nature r/b of tadalafil and I sent a Rx.   No follow-ups on file.  Jerilee Field, MD  [facility]  [address] [place], [place] [address] [phone]

## 2021-10-23 LAB — URINALYSIS, ROUTINE W REFLEX MICROSCOPIC
Bilirubin, UA: NEGATIVE
Glucose, UA: NEGATIVE
Leukocytes,UA: NEGATIVE
Nitrite, UA: NEGATIVE
Protein,UA: NEGATIVE
RBC, UA: NEGATIVE
Specific Gravity, UA: 1.02 (ref 1.005–1.030)
Urobilinogen, Ur: 0.2 mg/dL (ref 0.2–1.0)
pH, UA: 5.5 (ref 5.0–7.5)

## 2021-12-20 ENCOUNTER — Other Ambulatory Visit (HOSPITAL_COMMUNITY): Payer: Self-pay | Admitting: Neurological Surgery

## 2021-12-20 DIAGNOSIS — M5416 Radiculopathy, lumbar region: Secondary | ICD-10-CM

## 2021-12-23 ENCOUNTER — Ambulatory Visit (HOSPITAL_COMMUNITY)
Admission: RE | Admit: 2021-12-23 | Discharge: 2021-12-23 | Disposition: A | Discharge status: Home / Self Care | Payer: No Typology Code available for payment source | Source: Ambulatory Visit | Attending: Neurological Surgery | Admitting: Neurological Surgery

## 2021-12-23 DIAGNOSIS — M5416 Radiculopathy, lumbar region: Secondary | ICD-10-CM | POA: Diagnosis present

## 2022-07-23 DIAGNOSIS — N529 Male erectile dysfunction, unspecified: Secondary | ICD-10-CM | POA: Diagnosis not present

## 2022-07-23 DIAGNOSIS — F331 Major depressive disorder, recurrent, moderate: Secondary | ICD-10-CM | POA: Diagnosis not present

## 2022-07-23 DIAGNOSIS — Z13220 Encounter for screening for lipoid disorders: Secondary | ICD-10-CM | POA: Diagnosis not present

## 2022-07-23 DIAGNOSIS — R209 Unspecified disturbances of skin sensation: Secondary | ICD-10-CM | POA: Diagnosis not present

## 2022-07-23 DIAGNOSIS — Z23 Encounter for immunization: Secondary | ICD-10-CM | POA: Diagnosis not present

## 2022-07-23 DIAGNOSIS — Z0000 Encounter for general adult medical examination without abnormal findings: Secondary | ICD-10-CM | POA: Diagnosis not present

## 2022-12-27 ENCOUNTER — Encounter (HOSPITAL_COMMUNITY): Payer: Self-pay

## 2022-12-27 ENCOUNTER — Encounter (HOSPITAL_COMMUNITY)
Admission: RE | Admit: 2022-12-27 | Discharge: 2022-12-27 | Disposition: A | Discharge status: Home / Self Care | Payer: No Typology Code available for payment source | Source: Ambulatory Visit | Attending: Pulmonary Disease | Admitting: Pulmonary Disease

## 2022-12-27 VITALS — BP 134/80 | HR 75 | Ht 69.5 in | Wt 238.5 lb

## 2022-12-27 DIAGNOSIS — R0600 Dyspnea, unspecified: Secondary | ICD-10-CM | POA: Insufficient documentation

## 2022-12-27 NOTE — Progress Notes (Signed)
Manuel [name] 62 y.o. male  Pulmonary Rehab Orientation Note  This patient who was referred to Pulmonary Rehab by Dr. Charline Bills from the [facility] (Dr. Everardo All covering while [name] is in the program) with the diagnosis of dyspnea arrived today in Cardiac and Pulmonary Rehab. He arrived ambulatory with normal gait. He does not carry portable oxygen. Per patient, Manuel [name] never uses oxygen. Color good, skin warm and dry. Patient is oriented to time and place. Patient's medical history, psychosocial health, and medications reviewed.   Psychosocial assessment reveals patient lives with significant other, Manuel [name]. Manuel [name] is currently unemployed, disabled. Patient hobbies include watching tv, spending time with others, and reading. Patient reports his stress level is low. Areas of stress/anxiety include  his grandson . Patient does not exhibit signs of depression. PHQ2/9 score 0/0. Manuel [name] shows good  coping skills with positive outlook on life. Offered emotional support and reassurance. Will continue to monitor and evaluate progress toward psychosocial goal(s) of decreased stress.  Physical assessment reveals heart rate is normal, breath sounds rhonchi to BLU lobes, exp wheezes to RLL, diminished throughout. Pt denies chronic cough. Grip strength equal, strong. Distal pulses present. Manuel [name] reports he  does take medications as prescribed. Patient states he  follows a regular  diet. The patient reports no specific efforts to gain or lose weight.. Pt's weight will be monitored closely.   Demonstration and practice of PLB using pulse oximeter. Manuel [name] able to return demonstration satisfactorily. Safety and hand hygiene in the exercise area reviewed with patient. Manuel [name] voices understanding of the information reviewed. Department expectations discussed with patient and achievable goals were set. The patient shows enthusiasm about attending the program and we look forward to working with Manuel [name]. Manuel [name] completed  a 6 min walk test today and is scheduled to begin exercise on 01/02/23 at 1315.   [id number] Manuel Hart, RN, BSN

## 2022-12-27 NOTE — Progress Notes (Addendum)
Pulmonary Individual Treatment Plan  Patient Details  Name: Manuel [name] MRN: [id number] Date of Birth: 02-May-1960 Referring Provider:   Doristine Devoid Pulmonary Rehab Walk Test from 12/27/2022 in [facility] for Heart, Vascular, & Lung Health  Referring Provider [name]       Initial Encounter Date:  Flowsheet Row Pulmonary Rehab Walk Test from 12/27/2022 in [facility] for Heart, Vascular, & Lung Health  Date 12/27/22       Visit Diagnosis: Dyspnea, unspecified type  Patient's Home Medications on Admission:   Current Outpatient Medications:    albuterol (VENTOLIN HFA) 108 (90 Base) MCG/ACT inhaler, Inhale 2 puffs into the lungs every 6 (six) hours as needed for wheezing or shortness of breath., Disp: , Rfl:    amLODipine (NORVASC) 10 MG tablet, Take 10 mg by mouth daily., Disp: , Rfl:    atorvastatin (LIPITOR) 40 MG tablet, Take 40 mg by mouth daily., Disp: , Rfl:    cetirizine (ZYRTEC) 10 MG tablet, Take 10 mg by mouth at bedtime., Disp: , Rfl:    cholecalciferol (VITAMIN D3) 25 MCG (1000 UNIT) tablet, Take 1,000 Units by mouth daily., Disp: , Rfl:    cyclobenzaprine (FLEXERIL) 10 MG tablet, Take 1 tablet (10 mg total) by mouth 3 (three) times daily as needed for muscle spasms., Disp: 90 tablet, Rfl: 1   empagliflozin (JARDIANCE) 25 MG TABS tablet, Take 12.5 mg by mouth daily., Disp: , Rfl:    famotidine (PEPCID) 20 MG tablet, Take 20 mg by mouth daily as needed for heartburn or indigestion., Disp: , Rfl:    fluticasone (FLONASE) 50 MCG/ACT nasal spray, Place 2 sprays into both nostrils daily as needed for allergies or rhinitis., Disp: , Rfl:    gabapentin (NEURONTIN) 100 MG capsule, Take 100 mg by mouth 3 (three) times daily as needed (pain)., Disp: , Rfl:    isosorbide mononitrate (IMDUR) 30 MG 24 hr tablet, Take 30 mg by mouth daily., Disp: , Rfl:    losartan (COZAAR) 50 MG tablet, Take 50 mg by mouth daily.,  Disp: , Rfl:    meloxicam (MOBIC) 15 MG tablet, Take 15 mg by mouth daily as needed for pain., Disp: , Rfl:    metFORMIN (GLUCOPHAGE-XR) 500 MG 24 hr tablet, Take 500 mg by mouth 2 (two) times daily., Disp: , Rfl:    metoprolol tartrate (LOPRESSOR) 25 MG tablet, Take 12.5 mg by mouth 2 (two) times daily., Disp: , Rfl:    Mometasone Furoate (ASMANEX HFA) 100 MCG/ACT AERO, Inhale 2 puffs into the lungs at bedtime., Disp: , Rfl:    montelukast (SINGULAIR) 10 MG tablet, Take 10 mg by mouth at bedtime., Disp: , Rfl:    spironolactone (ALDACTONE) 25 MG tablet, Take 12.5 mg by mouth daily., Disp: , Rfl:    Tiotropium Bromide-Olodaterol (STIOLTO RESPIMAT) 2.5-2.5 MCG/ACT AERS, Inhale 2 each into the lungs daily., Disp: , Rfl:    cephALEXin (KEFLEX) 500 MG capsule, Take 1 capsule (500 mg total) by mouth every 6 (six) hours. (Patient not taking: Reported on 12/27/2022), Disp: 36 capsule, Rfl: 0   Oxycodone HCl 10 MG TABS, Take 1 tablet (10 mg total) by mouth every 4 (four) hours as needed ((score 7 to 10)). (Patient not taking: Reported on 12/27/2022), Disp: 40 tablet, Rfl: 0  Past Medical History: Past Medical History:  Diagnosis Date   Arthritis    Asthma    CHF (congestive heart failure) (HCC)    Diabetes mellitus  without complication (HCC)    Dyspnea    GERD (gastroesophageal reflux disease)    HLD (hyperlipidemia)    Hypertension    Pneumonia    1981   Seasonal allergies     Tobacco Use: Social History   Tobacco Use  Smoking Status Never  Smokeless Tobacco Never    Labs: Review Flowsheet       Latest Ref Rng & Units 07/16/2021  Labs for ITP Cardiac and Pulmonary Rehab  Hemoglobin A1c 4.8 - 5.6 % 10.2     Details            Capillary Blood Glucose: Lab Results  Component Value Date   GLUCAP 179 (H) 07/23/2021   GLUCAP 222 (H) 07/23/2021   GLUCAP 194 (H) 07/17/2021   GLUCAP 205 (H) 07/16/2021   GLUCAP 272 (H) 07/16/2021     Pulmonary Assessment Scores:  Pulmonary  Assessment Scores     Row Name 12/27/22 1058         ADL UCSD   ADL Phase Entry     SOB Score total 28       CAT Score   CAT Score 10       mMRC Score   mMRC Score 3             UCSD: Self-administered rating of dyspnea associated with activities of daily living (ADLs) 6-point scale (0 = "not at all" to 5 = "maximal or unable to do because of breathlessness")  Scoring Scores range from 0 to 120.  Minimally important difference is 5 units  CAT: CAT can identify the health impairment of COPD patients and is better correlated with disease progression.  CAT has a scoring range of zero to 40. The CAT score is classified into four groups of low (less than 10), medium (10 - 20), high (21-30) and very high (31-40) based on the impact level of disease on health status. A CAT score over 10 suggests significant symptoms.  A worsening CAT score could be explained by an exacerbation, poor medication adherence, poor inhaler technique, or progression of COPD or comorbid conditions.  CAT MCID is 2 points  mMRC: mMRC (Modified Medical Research Council) Dyspnea Scale is used to assess the degree of baseline functional disability in patients of respiratory disease due to dyspnea. No minimal important difference is established. A decrease in score of 1 point or greater is considered a positive change.   Pulmonary Function Assessment:  Pulmonary Function Assessment - 12/27/22 1058       Breath   Bilateral Breath Sounds Rhonchi;Wheezes;Expiratory;Decreased   BLU rhonchi, RLL exp wheezes, diminished throughout   Shortness of Breath Yes;Limiting activity             Exercise Target Goals: Exercise Program Goal: Individual exercise prescription set using results from initial 6 min walk test and THRR while considering  patient's activity barriers and safety.   Exercise Prescription Goal: Initial exercise prescription builds to 30-45 minutes a day of aerobic activity, 2-3 days per week.   Home exercise guidelines will be given to patient during program as part of exercise prescription that the participant will acknowledge.  Activity Barriers & Risk Stratification:  Activity Barriers & Cardiac Risk Stratification - 12/27/22 1054       Activity Barriers & Cardiac Risk Stratification   Activity Barriers Muscular Weakness;Deconditioning;Shortness of Breath;Neck/Spine Problems             6 Minute Walk:  6 Minute Walk  Row Name 12/27/22 1136         6 Minute Walk   Phase Initial     Distance 1260 feet     Walk Time 6 minutes     # of Rest Breaks 0     MPH 2.39     METS 3.12     RPE 12     Perceived Dyspnea  1     VO2 Peak 10.94     Symptoms No     Resting HR 75 bpm     Resting BP 134/80     Resting Oxygen Saturation  96 %     Exercise Oxygen Saturation  during 6 min walk 92 %     Max Ex. HR 107 bpm     Max Ex. BP 142/70     2 Minute Post BP 132/70       Interval HR   1 Minute HR 94     2 Minute HR 100     3 Minute HR 100     4 Minute HR 107     5 Minute HR 103     6 Minute HR 104     2 Minute Post HR 80     Interval Heart Rate? Yes       Interval Oxygen   Interval Oxygen? Yes     Baseline Oxygen Saturation % 96 %     1 Minute Oxygen Saturation % 96 %     1 Minute Liters of Oxygen 0 L     2 Minute Oxygen Saturation % 92 %     2 Minute Liters of Oxygen 0 L     3 Minute Oxygen Saturation % 94 %     3 Minute Liters of Oxygen 0 L     4 Minute Oxygen Saturation % 94 %     4 Minute Liters of Oxygen 0 L     5 Minute Oxygen Saturation % 94 %     5 Minute Liters of Oxygen 0 L     6 Minute Oxygen Saturation % 94 %     6 Minute Liters of Oxygen 0 L     2 Minute Post Oxygen Saturation % 97 %     2 Minute Post Liters of Oxygen 0 L              Oxygen Initial Assessment:  Oxygen Initial Assessment - 12/27/22 1055       Home Oxygen   Home Oxygen Device None    Sleep Oxygen Prescription None    Home Exercise Oxygen Prescription None     Home Resting Oxygen Prescription None      Initial 6 min Walk   Oxygen Used None      Program Oxygen Prescription   Program Oxygen Prescription None      Intervention   Short Term Goals To learn and understand importance of maintaining oxygen saturations>88%;To learn and understand importance of monitoring SPO2 with pulse oximeter and demonstrate accurate use of the pulse oximeter.;To learn and demonstrate proper pursed lip breathing techniques or other breathing techniques. ;To learn and demonstrate proper use of respiratory medications    Long  Term Goals Verbalizes importance of monitoring SPO2 with pulse oximeter and return demonstration;Maintenance of O2 saturations>88%;Exhibits proper breathing techniques, such as pursed lip breathing or other method taught during program session;Compliance with respiratory medication             Oxygen Re-Evaluation:  Oxygen Discharge (Final Oxygen Re-Evaluation):   Initial Exercise Prescription:  Initial Exercise Prescription - 12/27/22 1100       Date of Initial Exercise RX and Referring Provider   Date 12/27/22    Referring Provider Vivia Budge   Expected Discharge Date 03/25/23      Treadmill   MPH 2.2    Grade 0    Minutes 15      NuStep   Level 2    SPM 60    Minutes 15    METs 2      Prescription Details   Frequency (times per week) 2    Duration Progress to 30 minutes of continuous aerobic without signs/symptoms of physical distress      Intensity   THRR 40-80% of Max Heartrate 64-128    Ratings of Perceived Exertion 11-13    Perceived Dyspnea 0-4      Progression   Progression Continue to progress workloads to maintain intensity without signs/symptoms of physical distress.      Resistance Training   Training Prescription Yes    Weight blue bands    Reps 10-15             Perform Capillary Blood Glucose checks as needed.  Exercise Prescription Changes:   Exercise Comments:   Exercise  Goals and Review:   Exercise Goals     Row Name 12/27/22 1054             Exercise Goals   Increase Physical Activity Yes       Intervention Provide advice, education, support and counseling about physical activity/exercise needs.;Develop an individualized exercise prescription for aerobic and resistive training based on initial evaluation findings, risk stratification, comorbidities and participant's personal goals.       Expected Outcomes Short Term: Attend rehab on a regular basis to increase amount of physical activity.;Long Term: Add in home exercise to make exercise part of routine and to increase amount of physical activity.;Long Term: Exercising regularly at least 3-5 days a week.       Increase Strength and Stamina Yes       Intervention Provide advice, education, support and counseling about physical activity/exercise needs.;Develop an individualized exercise prescription for aerobic and resistive training based on initial evaluation findings, risk stratification, comorbidities and participant's personal goals.       Expected Outcomes Short Term: Increase workloads from initial exercise prescription for resistance, speed, and METs.;Short Term: Perform resistance training exercises routinely during rehab and add in resistance training at home;Long Term: Improve cardiorespiratory fitness, muscular endurance and strength as measured by increased METs and functional capacity ( )       Able to understand and use rate of perceived exertion (RPE) scale Yes       Intervention Provide education and explanation on how to use RPE scale       Expected Outcomes Short Term: Able to use RPE daily in rehab to express subjective intensity level;Long Term:  Able to use RPE to guide intensity level when exercising independently       Able to understand and use Dyspnea scale Yes       Intervention Provide education and explanation on how to use Dyspnea scale       Expected Outcomes Short Term: Able to  use Dyspnea scale daily in rehab to express subjective sense of shortness of breath during exertion;Long Term: Able to use Dyspnea scale to guide intensity level when exercising independently  Knowledge and understanding of Target Heart Rate Range (THRR) Yes       Intervention Provide education and explanation of THRR including how the numbers were predicted and where they are located for reference       Expected Outcomes Short Term: Able to state/look up THRR;Short Term: Able to use daily as guideline for intensity in rehab;Long Term: Able to use THRR to govern intensity when exercising independently       Understanding of Exercise Prescription Yes       Intervention Provide education, explanation, and written materials on patient's individual exercise prescription       Expected Outcomes Short Term: Able to explain program exercise prescription;Long Term: Able to explain home exercise prescription to exercise independently                Exercise Goals Re-Evaluation :   Discharge Exercise Prescription (Final Exercise Prescription Changes):   Nutrition:  Target Goals: Understanding of nutrition guidelines, daily intake of sodium 1500mg , cholesterol 200mg , calories 30% from fat and 7% or less from saturated fats, daily to have 5 or more servings of fruits and vegetables.  Biometrics:    Nutrition Therapy Plan and Nutrition Goals:   Nutrition Assessments:  MEDIFICTS Score Key: >=70 Need to make dietary changes  40-70 Heart Healthy Diet <= 40 Therapeutic Level Cholesterol Diet   Picture Your Plate Scores: <82 Unhealthy dietary pattern with much room for improvement. 41-50 Dietary pattern unlikely to meet recommendations for good health and room for improvement. 51-60 More healthful dietary pattern, with some room for improvement.  >60 Healthy dietary pattern, although there may be some specific behaviors that could be improved.    Nutrition Goals  Re-Evaluation:   Nutrition Goals Discharge (Final Nutrition Goals Re-Evaluation):   Psychosocial: Target Goals: Acknowledge presence or absence of significant depression and/or stress, maximize coping skills, provide positive support system. Participant is able to verbalize types and ability to use techniques and skills needed for reducing stress and depression.  Initial Review & Psychosocial Screening:  Initial Psych Review & Screening - 12/27/22 1056       Initial Review   Current issues with --   denies psy/soc barriers, states he worries about grandson with autism     Family Dynamics   Good Support System? Yes    Comments significant other Porfirio Mylar, and 2 grown daughters      Barriers   Psychosocial barriers to participate in program There are no identifiable barriers or psychosocial needs.      Screening Interventions   Interventions Encouraged to exercise;Provide feedback about the scores to participant    Expected Outcomes Long Term Goal: Stressors or current issues are controlled or eliminated.             Quality of Life Scores:  Scores of 19 and below usually indicate a poorer quality of life in these areas.  A difference of  2-3 points is a clinically meaningful difference.  A difference of 2-3 points in the total score of the Quality of Life Index has been associated with significant improvement in overall quality of life, self-image, physical symptoms, and general health in studies assessing change in quality of life.  PHQ-9: Review Flowsheet       12/27/2022  Depression screen PHQ 2/9  Decreased Interest 0  Down, Depressed, Hopeless 0  PHQ - 2 Score 0  Altered sleeping 0  Tired, decreased energy 0  Change in appetite 0  Feeling bad or failure about yourself  0  Trouble concentrating 0  Moving slowly or fidgety/restless 0  Suicidal thoughts 0  PHQ-9 Score 0    Details           Interpretation of Total Score  Total Score Depression Severity:   1-4 = Minimal depression, 5-9 = Mild depression, 10-14 = Moderate depression, 15-19 = Moderately severe depression, 20-27 = Severe depression   Psychosocial Evaluation and Intervention:  Psychosocial Evaluation - 12/27/22 1100       Psychosocial Evaluation & Interventions   Interventions Encouraged to exercise with the program and follow exercise prescription    Comments [name] denies psy/soc barriers or concerns at this time. He states that he worries about the future of his grandson with autism.    Expected Outcomes For [name] to participate in PR free of any psy/soc barriers or concerns    Continue Psychosocial Services  No Follow up required             Psychosocial Re-Evaluation:   Psychosocial Discharge (Final Psychosocial Re-Evaluation):   Education: Education Goals: Education classes will be provided on a weekly basis, covering required topics. Participant will state understanding/return demonstration of topics presented.  Learning Barriers/Preferences:  Learning Barriers/Preferences - 12/27/22 1129       Learning Barriers/Preferences   Learning Barriers Exercise Concerns   due to neck and back problems   Learning Preferences Group Instruction;Verbal Instruction;Written Material             Education Topics: Know Your Numbers Group instruction that is supported by a PowerPoint presentation. Instructor discusses importance of knowing and understanding resting, exercise, and post-exercise oxygen saturation, heart rate, and blood pressure. Oxygen saturation, heart rate, blood pressure, rating of perceived exertion, and dyspnea are reviewed along with a normal range for these values.    Exercise for the Pulmonary Patient Group instruction that is supported by a PowerPoint presentation. Instructor discusses benefits of exercise, core components of exercise, frequency, duration, and intensity of an exercise routine, importance of utilizing pulse oximetry during exercise,  safety while exercising, and options of places to exercise outside of rehab.    MET Level  Group instruction provided by PowerPoint, verbal discussion, and written material to support subject matter. Instructor reviews what METs are and how to increase METs.    Pulmonary Medications Verbally interactive group education provided by instructor with focus on inhaled medications and proper administration.   Anatomy and Physiology of the Respiratory System Group instruction provided by PowerPoint, verbal discussion, and written material to support subject matter. Instructor reviews respiratory cycle and anatomical components of the respiratory system and their functions. Instructor also reviews differences in obstructive and restrictive respiratory diseases with examples of each.    Oxygen Safety Group instruction provided by PowerPoint, verbal discussion, and written material to support subject matter. There is an overview of "What is Oxygen" and "Why do we need it".  Instructor also reviews how to create a safe environment for oxygen use, the importance of using oxygen as prescribed, and the risks of noncompliance. There is a brief discussion on traveling with oxygen and resources the patient may utilize.   Oxygen Use Group instruction provided by PowerPoint, verbal discussion, and written material to discuss how supplemental oxygen is prescribed and different types of oxygen supply systems. Resources for more information are provided.    Breathing Techniques Group instruction that is supported by demonstration and informational handouts. Instructor discusses the benefits of pursed lip and diaphragmatic breathing and detailed demonstration on how to perform  both.     Risk Factor Reduction Group instruction that is supported by a PowerPoint presentation. Instructor discusses the definition of a risk factor, different risk factors for pulmonary disease, and how the heart and lungs work  together.   Pulmonary Diseases Group instruction provided by PowerPoint, verbal discussion, and written material to support subject matter. Instructor gives an overview of the different type of pulmonary diseases. There is also a discussion on risk factors and symptoms as well as ways to manage the diseases.   Stress and Energy Conservation Group instruction provided by PowerPoint, verbal discussion, and written material to support subject matter. Instructor gives an overview of stress and the impact it can have on the body. Instructor also reviews ways to reduce stress. There is also a discussion on energy conservation and ways to conserve energy throughout the day.   Warning Signs and Symptoms Group instruction provided by PowerPoint, verbal discussion, and written material to support subject matter. Instructor reviews warning signs and symptoms of stroke, heart attack, cold and flu. Instructor also reviews ways to prevent the spread of infection.   Other Education Group or individual verbal, written, or video instructions that support the educational goals of the pulmonary rehab program.    Knowledge Questionnaire Score:  Knowledge Questionnaire Score - 12/27/22 1130       Knowledge Questionnaire Score   Pre Score 16/18             Core Components/Risk Factors/Patient Goals at Admission:  Personal Goals and Risk Factors at Admission - 12/27/22 1101       Core Components/Risk Factors/Patient Goals on Admission   Improve shortness of breath with ADL's Yes    Intervention Provide education, individualized exercise plan and daily activity instruction to help decrease symptoms of SOB with activities of daily living.    Expected Outcomes Short Term: Improve cardiorespiratory fitness to achieve a reduction of symptoms when performing ADLs;Long Term: Be able to perform more ADLs without symptoms or delay the onset of symptoms             Core Components/Risk Factors/Patient  Goals Review:    Core Components/Risk Factors/Patient Goals at Discharge (Final Review):    ITP Comments: Dr. Mechele Collin is Medical Director for Pulmonary Rehab at [facility].

## 2022-12-30 ENCOUNTER — Telehealth (HOSPITAL_COMMUNITY): Payer: Self-pay

## 2022-12-30 NOTE — Telephone Encounter (Signed)
[name] with [facility] called and is requesting notes from [facility] orientation. Please address them to [facility] before sending. fax number [phone]

## 2023-01-02 ENCOUNTER — Telehealth (HOSPITAL_COMMUNITY): Payer: Self-pay

## 2023-01-02 ENCOUNTER — Encounter (HOSPITAL_COMMUNITY): Payer: No Typology Code available for payment source

## 2023-01-02 NOTE — Telephone Encounter (Signed)
Pt called and can not come to pulmonary rehab due to having back pain!

## 2023-01-07 ENCOUNTER — Encounter (HOSPITAL_COMMUNITY)
Admission: RE | Admit: 2023-01-07 | Discharge: 2023-01-07 | Disposition: A | Discharge status: Home / Self Care | Payer: No Typology Code available for payment source | Source: Ambulatory Visit | Attending: Pulmonary Disease | Admitting: Pulmonary Disease

## 2023-01-07 DIAGNOSIS — R0600 Dyspnea, unspecified: Secondary | ICD-10-CM | POA: Diagnosis not present

## 2023-01-07 LAB — GLUCOSE, CAPILLARY
Glucose-Capillary: 136 mg/dL — ABNORMAL HIGH (ref 70–99)
Glucose-Capillary: 109 mg/dL — ABNORMAL HIGH (ref 70–99)

## 2023-01-07 NOTE — Progress Notes (Signed)
Daily Session Note  Patient Details  Name: Manuel [name] MRN: [id number] Date of Birth: 03/27/1960 Referring Provider:   Doristine Devoid Pulmonary Rehab Walk Test from 12/27/2022 in [facility] for Heart, Vascular, & Lung Health  Referring Provider [name]       Encounter Date: 01/07/2023  Check In:  Session Check In - 01/07/23 1327       Check-In   Supervising physician immediately available to respond to emergencies [facility] MD immediately available    Physician(s) Bernadene Person, NP    Location [facility]    Staff Present Essie Hart, RN, Doris Cheadle, MS, ACSM-CEP, Exercise Physiologist;Deolinda Frid Erin Sons BS, ACSM-CEP, Exercise Physiologist    Virtual Visit No    Medication changes reported     No    Fall or balance concerns reported    No    Tobacco Cessation No Change    Warm-up and Cool-down Performed as group-led instruction   only   Resistance Training Performed Yes    VAD Patient? No    PAD/SET Patient? No      Pain Assessment   Currently in Pain? No/denies    Multiple Pain Sites No             Capillary Blood Glucose: Results for orders placed or performed during the hospital encounter of 12/27/22 (from the past 24 hours)  Glucose, capillary     Status: Abnormal   Collection Time: 01/07/23  1:18 PM  Result Value Ref Range   Glucose-Capillary 136 (H) 70 - 99 mg/dL  Glucose, capillary     Status: Abnormal   Collection Time: 01/07/23  2:27 PM  Result Value Ref Range   Glucose-Capillary 109 (H) 70 - 99 mg/dL      Social History   Tobacco Use  Smoking Status Never  Smokeless Tobacco Never    Goals Met:  Proper associated with RPD/PD & O2 Sat Independence with exercise equipment Exercise tolerated well No report of concerns or symptoms today Strength training completed today  Goals Unmet:  Not Applicable  Comments: Service time is from 1312 to 1439.    Dr. Mechele Collin is Medical Director for Pulmonary Rehab at [facility].

## 2023-01-09 ENCOUNTER — Encounter (HOSPITAL_COMMUNITY)
Admission: RE | Admit: 2023-01-09 | Discharge: 2023-01-09 | Disposition: A | Discharge status: Home / Self Care | Payer: No Typology Code available for payment source | Source: Ambulatory Visit | Attending: Pulmonary Disease | Admitting: Pulmonary Disease

## 2023-01-09 DIAGNOSIS — R0600 Dyspnea, unspecified: Secondary | ICD-10-CM | POA: Diagnosis not present

## 2023-01-09 LAB — GLUCOSE, CAPILLARY
Glucose-Capillary: 101 mg/dL — ABNORMAL HIGH (ref 70–99)
Glucose-Capillary: 96 mg/dL (ref 70–99)

## 2023-01-09 NOTE — Progress Notes (Signed)
Daily Session Note  Patient Details  Name: Manuel [name] MRN: [id number] Date of Birth: 12-13-1960 Referring Provider:   Doristine Devoid Pulmonary Rehab Walk Test from 12/27/2022 in [facility]  Referring Provider [name]       Encounter Date: 01/09/2023  Check In:  Session Check In - 01/09/23 1339       Check-In   Supervising physician immediately available to respond to emergencies [facility] MD immediately available    Physician(s) Robin Searing, NP    Location [facility]    Staff Present Essie Hart, RN, Doris Cheadle, MS, ACSM-CEP, Exercise Physiologist;Casey Erin Sons BS, ACSM-CEP, Exercise Physiologist    Virtual Visit No    Medication changes reported     No    Fall or balance concerns reported    No    Tobacco Cessation No Change    Warm-up and Cool-down Performed as group-led instruction    Resistance Training Performed Yes    VAD Patient? No    PAD/SET Patient? No      Pain Assessment   Currently in Pain? No/denies    Pain Score 0-No pain    Multiple Pain Sites No             Capillary Blood Glucose: Results for orders placed or performed during the hospital encounter of 01/09/23 (from the past 24 hours)  Glucose, capillary     Status: None   Collection Time: 01/09/23  2:30 PM  Result Value Ref Range   Glucose-Capillary 96 70 - 99 mg/dL      Social History   Tobacco Use  Smoking Status Never  Smokeless Tobacco Never    Goals Met:  Proper associated with RPD/PD & O2 Sat Exercise tolerated well No report of concerns or symptoms today Strength training completed today  Goals Unmet:  Not Applicable  Comments: Service time is from 1304 to 1434.    Dr. Mechele Collin is Medical Director for Pulmonary Rehab at [facility].

## 2023-01-14 ENCOUNTER — Encounter (HOSPITAL_COMMUNITY)
Admission: RE | Admit: 2023-01-14 | Discharge: 2023-01-14 | Disposition: A | Discharge status: Home / Self Care | Payer: No Typology Code available for payment source | Source: Ambulatory Visit | Attending: Pulmonary Disease | Admitting: Pulmonary Disease

## 2023-01-14 VITALS — Wt 232.8 lb

## 2023-01-14 DIAGNOSIS — R0600 Dyspnea, unspecified: Secondary | ICD-10-CM

## 2023-01-14 LAB — GLUCOSE, CAPILLARY
Glucose-Capillary: 145 mg/dL — ABNORMAL HIGH (ref 70–99)
Glucose-Capillary: 102 mg/dL — ABNORMAL HIGH (ref 70–99)

## 2023-01-14 NOTE — Progress Notes (Signed)
Pulmonary Individual Treatment Plan  Patient Details  Name: Manuel [name] MRN: [id number] Date of Birth: 12/31/1960 Referring Provider:   Doristine Devoid Pulmonary Rehab Walk Test from 12/27/2022 in [facility]  Referring Provider [name]       Initial Encounter Date:  Flowsheet Row Pulmonary Rehab Walk Test from 12/27/2022 in [facility], [facility], [facility]  Date 12/27/22       Visit Diagnosis: Dyspnea, unspecified type  Patient's Home Medications on Admission:   Current Outpatient Medications:    albuterol (VENTOLIN HFA) 108 (90 Base) MCG/ACT inhaler, Inhale 2 puffs into the lungs every 6 (six) hours as needed for wheezing or shortness of breath., Disp: , Rfl:    amLODipine (NORVASC) 10 MG tablet, Take 10 mg by mouth daily., Disp: , Rfl:    atorvastatin (LIPITOR) 40 MG tablet, Take 40 mg by mouth daily., Disp: , Rfl:    cephALEXin (KEFLEX) 500 MG capsule, Take 1 capsule (500 mg total) by mouth every 6 (six) hours. (Patient not taking: Reported on 12/27/2022), Disp: 36 capsule, Rfl: 0   cetirizine (ZYRTEC) 10 MG tablet, Take 10 mg by mouth at bedtime., Disp: , Rfl:    cholecalciferol (VITAMIN D3) 25 MCG (1000 UNIT) tablet, Take 1,000 Units by mouth daily., Disp: , Rfl:    cyclobenzaprine (FLEXERIL) 10 MG tablet, Take 1 tablet (10 mg total) by mouth 3 (three) times daily as needed for muscle spasms., Disp: 90 tablet, Rfl: 1   empagliflozin (JARDIANCE) 25 MG TABS tablet, Take 12.5 mg by mouth daily., Disp: , Rfl:    famotidine (PEPCID) 20 MG tablet, Take 20 mg by mouth daily as needed for heartburn or indigestion., Disp: , Rfl:    fluticasone (FLONASE) 50 MCG/ACT nasal spray, Place 2 sprays into both nostrils daily as needed for allergies or rhinitis., Disp: , Rfl:    gabapentin (NEURONTIN) 100 MG capsule, Take 100 mg by mouth 3 (three) times daily as needed (pain).,  Disp: , Rfl:    isosorbide mononitrate (IMDUR) 30 MG 24 hr tablet, Take 30 mg by mouth daily., Disp: , Rfl:    losartan (COZAAR) 50 MG tablet, Take 50 mg by mouth daily., Disp: , Rfl:    meloxicam (MOBIC) 15 MG tablet, Take 15 mg by mouth daily as needed for pain., Disp: , Rfl:    metFORMIN (GLUCOPHAGE-XR) 500 MG 24 hr tablet, Take 500 mg by mouth 2 (two) times daily., Disp: , Rfl:    metoprolol tartrate (LOPRESSOR) 25 MG tablet, Take 12.5 mg by mouth 2 (two) times daily., Disp: , Rfl:    Mometasone Furoate (ASMANEX HFA) 100 MCG/ACT AERO, Inhale 2 puffs into the lungs at bedtime., Disp: , Rfl:    montelukast (SINGULAIR) 10 MG tablet, Take 10 mg by mouth at bedtime., Disp: , Rfl:    Oxycodone HCl 10 MG TABS, Take 1 tablet (10 mg total) by mouth every 4 (four) hours as needed ((score 7 to 10)). (Patient not taking: Reported on 12/27/2022), Disp: 40 tablet, Rfl: 0   spironolactone (ALDACTONE) 25 MG tablet, Take 12.5 mg by mouth daily., Disp: , Rfl:    Tiotropium Bromide-Olodaterol (STIOLTO RESPIMAT) 2.5-2.5 MCG/ACT AERS, Inhale 2 each into the lungs daily., Disp: , Rfl:   Past Medical History: Past Medical History:  Diagnosis Date   Arthritis    Asthma    CHF (congestive heart failure) (HCC)    Diabetes mellitus  without complication (HCC)    Dyspnea    GERD (gastroesophageal reflux disease)    HLD (hyperlipidemia)    Hypertension    Pneumonia    1981   Seasonal allergies     Tobacco Use: Social History   Tobacco Use  Smoking Status Never  Smokeless Tobacco Never    Labs: Review Flowsheet       Latest Ref Rng & Units 07/16/2021  Labs for ITP Cardiac and Pulmonary Rehab  Hemoglobin A1c 4.8 - 5.6 % 10.2     Capillary Blood Glucose: Lab Results  Component Value Date   GLUCAP 96 01/09/2023   GLUCAP 101 (H) 01/09/2023   GLUCAP 109 (H) 01/07/2023   GLUCAP 136 (H) 01/07/2023   GLUCAP 179 (H) 07/23/2021     Pulmonary Assessment Scores:  Pulmonary Assessment Scores     Row  Name 12/27/22 1058         ADL UCSD   ADL Phase Entry     SOB Score total 28       CAT Score   CAT Score 10       mMRC Score   mMRC Score 3             UCSD: Self-administered rating of dyspnea associated with activities of daily living (ADLs) 6-point scale (0 = "not at all" to 5 = "maximal or unable to do because of breathlessness")  Scoring Scores range from 0 to 120.  Minimally important difference is 5 units  CAT: CAT can identify the health impairment of COPD patients and is better correlated with disease progression.  CAT has a scoring range of zero to 40. The CAT score is classified into four groups of low (less than 10), medium (10 - 20), high (21-30) and very high (31-40) based on the impact level of disease on health status. A CAT score over 10 suggests significant symptoms.  A worsening CAT score could be explained by an exacerbation, poor medication adherence, poor inhaler technique, or progression of COPD or comorbid conditions.  CAT MCID is 2 points  mMRC: mMRC (Modified Medical Research Council) Dyspnea Scale is used to assess the degree of baseline functional disability in patients of respiratory disease due to dyspnea. No minimal important difference is established. A decrease in score of 1 point or greater is considered a positive change.   Pulmonary Function Assessment:  Pulmonary Function Assessment - 12/27/22 1058       Breath   Bilateral Breath Sounds Rhonchi;Wheezes;Expiratory;Decreased   BLU rhonchi, RLL exp wheezes, diminished throughout   Shortness of Breath Yes;Limiting activity             Exercise Target Goals: Exercise Program Goal: Individual exercise prescription set using results from initial 6 min walk test and THRR while considering  patient's activity barriers and safety.   Exercise Prescription Goal: Initial exercise prescription builds to 30-45 minutes a day of aerobic activity, 2-3 days per week.  Home exercise guidelines will  be given to patient during program as part of exercise prescription that the participant will acknowledge.  Activity Barriers & Risk Stratification:  Activity Barriers & Cardiac Risk Stratification - 12/27/22 1054       Activity Barriers & Cardiac Risk Stratification   Activity Barriers Muscular Weakness;Deconditioning;Shortness of Breath;Neck/Spine Problems             6 Minute Walk:  6 Minute Walk     Row Name 12/27/22 1136         6  Minute Walk   Phase Initial     Distance 1260 feet     Walk Time 6 minutes     # of Rest Breaks 0     MPH 2.39     METS 3.12     RPE 12     Perceived Dyspnea  1     VO2 Peak 10.94     Symptoms No     Resting HR 75 bpm     Resting BP 134/80     Resting Oxygen Saturation  96 %     Exercise Oxygen Saturation  during 6 min walk 92 %     Max Ex. HR 107 bpm     Max Ex. BP 142/70     2 Minute Post BP 132/70       Interval HR   1 Minute HR 94     2 Minute HR 100     3 Minute HR 100     4 Minute HR 107     5 Minute HR 103     6 Minute HR 104     2 Minute Post HR 80     Interval Heart Rate? Yes       Interval Oxygen   Interval Oxygen? Yes     Baseline Oxygen Saturation % 96 %     1 Minute Oxygen Saturation % 96 %     1 Minute Liters of Oxygen 0 L     2 Minute Oxygen Saturation % 92 %     2 Minute Liters of Oxygen 0 L     3 Minute Oxygen Saturation % 94 %     3 Minute Liters of Oxygen 0 L     4 Minute Oxygen Saturation % 94 %     4 Minute Liters of Oxygen 0 L     5 Minute Oxygen Saturation % 94 %     5 Minute Liters of Oxygen 0 L     6 Minute Oxygen Saturation % 94 %     6 Minute Liters of Oxygen 0 L     2 Minute Post Oxygen Saturation % 97 %     2 Minute Post Liters of Oxygen 0 L              Oxygen Initial Assessment:  Oxygen Initial Assessment - 12/27/22 1055       Home Oxygen   Home Oxygen Device None    Sleep Oxygen Prescription None    Home Exercise Oxygen Prescription None    Home Resting Oxygen  Prescription None      Initial 6 min Walk   Oxygen Used None      Program Oxygen Prescription   Program Oxygen Prescription None      Intervention   Short Term Goals To learn and understand importance of maintaining oxygen saturations>88%;To learn and understand importance of monitoring SPO2 with pulse oximeter and demonstrate accurate use of the pulse oximeter.;To learn and demonstrate proper pursed lip breathing techniques or other breathing techniques. ;To learn and demonstrate proper use of respiratory medications    Long  Term Goals Verbalizes importance of monitoring SPO2 with pulse oximeter and return demonstration;Maintenance of O2 saturations>88%;Exhibits proper breathing techniques, such as pursed lip breathing or other method taught during program session;Compliance with respiratory medication             Oxygen Re-Evaluation:  Oxygen Re-Evaluation     Row Name 12/27/22 1055  Goals/Expected Outcomes   Goals/Expected Outcomes Compliance and understanding of oxygen saturation monitoring and breathing techniques to decrease shortness of breath                Oxygen Discharge (Final Oxygen Re-Evaluation):  Oxygen Re-Evaluation - 12/27/22 1055       Goals/Expected Outcomes   Goals/Expected Outcomes Compliance and understanding of oxygen saturation monitoring and breathing techniques to decrease shortness of breath             Initial Exercise Prescription:  Initial Exercise Prescription - 12/27/22 1100       Date of Initial Exercise RX and Referring Provider   Date 12/27/22    Referring Provider Vivia Budge   Expected Discharge Date 03/25/23      Treadmill   MPH 2.2    Grade 0    Minutes 15      NuStep   Level 2    SPM 60    Minutes 15    METs 2      Prescription Details   Frequency (times per week) 2    Duration Progress to 30 minutes of continuous aerobic without signs/symptoms of physical distress      Intensity    THRR 40-80% of Max Heartrate 64-128    Ratings of Perceived Exertion 11-13    Perceived Dyspnea 0-4      Progression   Progression Continue to progress workloads to maintain intensity without signs/symptoms of physical distress.      Resistance Training   Training Prescription Yes    Weight blue bands    Reps 10-15             Perform Capillary Blood Glucose checks as needed.  Exercise Prescription Changes:   Exercise Comments:   Exercise Goals and Review:   Exercise Goals     Row Name 12/27/22 1054             Exercise Goals   Increase Physical Activity Yes       Intervention Provide advice, education, support and counseling about physical activity/exercise needs.;Develop an individualized exercise prescription for aerobic and resistive training based on initial evaluation findings, risk stratification, comorbidities and participant's personal goals.       Expected Outcomes Short Term: Attend rehab on a regular basis to increase amount of physical activity.;Long Term: Add in home exercise to make exercise part of routine and to increase amount of physical activity.;Long Term: Exercising regularly at least 3-5 days a week.       Increase Strength and Stamina Yes       Intervention Provide advice, education, support and counseling about physical activity/exercise needs.;Develop an individualized exercise prescription for aerobic and resistive training based on initial evaluation findings, risk stratification, comorbidities and participant's personal goals.       Expected Outcomes Short Term: Increase workloads from initial exercise prescription for resistance, speed, and METs.;Short Term: Perform resistance training exercises routinely during rehab and add in resistance training at home;Long Term: Improve cardiorespiratory fitness, muscular endurance and strength as measured by increased METs and functional capacity ( )       Able to understand and use rate of perceived  exertion (RPE) scale Yes       Intervention Provide education and explanation on how to use RPE scale       Expected Outcomes Short Term: Able to use RPE daily in rehab to express subjective intensity level;Long Term:  Able to use RPE to guide intensity level when  exercising independently       Able to understand and use Dyspnea scale Yes       Intervention Provide education and explanation on how to use Dyspnea scale       Expected Outcomes Short Term: Able to use Dyspnea scale daily in rehab to express subjective sense of shortness of breath during exertion;Long Term: Able to use Dyspnea scale to guide intensity level when exercising independently       Knowledge and understanding of Target Heart Rate Range (THRR) Yes       Intervention Provide education and explanation of THRR including how the numbers were predicted and where they are located for reference       Expected Outcomes Short Term: Able to state/look up THRR;Short Term: Able to use daily as guideline for intensity in rehab;Long Term: Able to use THRR to govern intensity when exercising independently       Understanding of Exercise Prescription Yes       Intervention Provide education, explanation, and written materials on patient's individual exercise prescription       Expected Outcomes Short Term: Able to explain program exercise prescription;Long Term: Able to explain home exercise prescription to exercise independently                Exercise Goals Re-Evaluation :  Exercise Goals Re-Evaluation     Row Name 01/02/23 0830             Exercise Goal Re-Evaluation   Exercise Goals Review Increase Physical Activity;Able to understand and use Dyspnea scale;Understanding of Exercise Prescription;Increase Strength and Stamina;Knowledge and understanding of Target Heart Rate Range (THRR);Able to understand and use rate of perceived exertion (RPE) scale       Comments Pt scheduled to begin exercise 12/12. Will monitor and  progress as tolerated.       Expected Outcomes Through exercise at rehab and home, the patient will decrease shortness of breath with daily activities and feel confident in carrying out an exercise regimen at home                Discharge Exercise Prescription (Final Exercise Prescription Changes):   Nutrition:  Target Goals: Understanding of nutrition guidelines, daily intake of sodium 1500mg , cholesterol 200mg , calories 30% from fat and 7% or less from saturated fats, daily to have 5 or more servings of fruits and vegetables.  Biometrics:    Nutrition Therapy Plan and Nutrition Goals:   Nutrition Assessments:  MEDIFICTS Score Key: >=70 Need to make dietary changes  40-70 Heart Healthy Diet <= 40 Therapeutic Level Cholesterol Diet   Picture Your Plate Scores: <84 Unhealthy dietary pattern with much room for improvement. 41-50 Dietary pattern unlikely to meet recommendations for good health and room for improvement. 51-60 More healthful dietary pattern, with some room for improvement.  >60 Healthy dietary pattern, although there may be some specific behaviors that could be improved.    Nutrition Goals Re-Evaluation:   Nutrition Goals Discharge (Final Nutrition Goals Re-Evaluation):   Psychosocial: Target Goals: Acknowledge presence or absence of significant depression and/or stress, maximize coping skills, provide positive support system. Participant is able to verbalize types and ability to use techniques and skills needed for reducing stress and depression.  Initial Review & Psychosocial Screening:  Initial Psych Review & Screening - 12/27/22 1056       Initial Review   Current issues with --   denies psy/soc barriers, states he worries about grandson with autism     Family  Dynamics   Good Support System? Yes    Comments significant other Porfirio Mylar, and 2 grown daughters      Barriers   Psychosocial barriers to participate in program There are no  identifiable barriers or psychosocial needs.      Screening Interventions   Interventions Encouraged to exercise;Provide feedback about the scores to participant    Expected Outcomes Long Term Goal: Stressors or current issues are controlled or eliminated.             Quality of Life Scores:  Scores of 19 and below usually indicate a poorer quality of life in these areas.  A difference of  2-3 points is a clinically meaningful difference.  A difference of 2-3 points in the total score of the Quality of Life Index has been associated with significant improvement in overall quality of life, self-image, physical symptoms, and general health in studies assessing change in quality of life.  PHQ-9: Review Flowsheet       12/27/2022  Depression screen PHQ 2/9  Decreased Interest 0  Down, Depressed, Hopeless 0  PHQ - 2 Score 0  Altered sleeping 0  Tired, decreased energy 0  Change in appetite 0  Feeling bad or failure about yourself  0  Trouble concentrating 0  Moving slowly or fidgety/restless 0  Suicidal thoughts 0  PHQ-9 Score 0   Interpretation of Total Score  Total Score Depression Severity:  1-4 = Minimal depression, 5-9 = Mild depression, 10-14 = Moderate depression, 15-19 = Moderately severe depression, 20-27 = Severe depression   Psychosocial Evaluation and Intervention:  Psychosocial Evaluation - 12/27/22 1100       Psychosocial Evaluation & Interventions   Interventions Encouraged to exercise with the program and follow exercise prescription    Comments Manuel [name] denies psy/soc barriers or concerns at this time. He states that he worries about the future of his grandson with autism.    Expected Outcomes For Manuel [name] to participate in PR free of any psy/soc barriers or concerns    Continue Psychosocial Services  No Follow up required             Psychosocial Re-Evaluation:  Psychosocial Re-Evaluation     Row Name 01/06/23 1206             Psychosocial  Re-Evaluation   Current issues with None Identified       Comments Unable to assess any changes. Manuel [name] has not started the program yet. He has been ill. He will hopefully come this week.       Expected Outcomes For Manuel [name] to participate in PR free of any psy/soc barriers or concerns.       Interventions Encouraged to attend Pulmonary Rehabilitation for the exercise       Continue Psychosocial Services  No Follow up required                Psychosocial Discharge (Final Psychosocial Re-Evaluation):  Psychosocial Re-Evaluation - 01/06/23 1206       Psychosocial Re-Evaluation   Current issues with None Identified    Comments Unable to assess any changes. Manuel [name] has not started the program yet. He has been ill. He will hopefully come this week.    Expected Outcomes For Manuel [name] to participate in PR free of any psy/soc barriers or concerns.    Interventions Encouraged to attend Pulmonary Rehabilitation for the exercise    Continue Psychosocial Services  No Follow up required  Education: Education Goals: Education classes will be provided on a weekly basis, covering required topics. Participant will state understanding/return demonstration of topics presented.  Learning Barriers/Preferences:  Learning Barriers/Preferences - 12/27/22 1129       Learning Barriers/Preferences   Learning Barriers Exercise Concerns   due to neck and back problems   Learning Preferences Group Instruction;Verbal Instruction;Written Material             Education Topics: Know Your Numbers Group instruction that is supported by a PowerPoint presentation. Instructor discusses importance of knowing and understanding resting, exercise, and post-exercise oxygen saturation, heart rate, and blood pressure. Oxygen saturation, heart rate, blood pressure, rating of perceived exertion, and dyspnea are reviewed along with a normal range for these values.    Exercise for the Pulmonary Patient Group instruction  that is supported by a PowerPoint presentation. Instructor discusses benefits of exercise, core components of exercise, frequency, duration, and intensity of an exercise routine, importance of utilizing pulse oximetry during exercise, safety while exercising, and options of places to exercise outside of rehab.    MET Level  Group instruction provided by PowerPoint, verbal discussion, and written material to support subject matter. Instructor reviews what METs are and how to increase METs.    Pulmonary Medications Verbally interactive group education provided by instructor with focus on inhaled medications and proper administration. Flowsheet Row PULMONARY REHAB OTHER RESPIRATORY from 01/09/2023 in [facility]  Date 01/09/23  Educator RT  Instruction Review Code 1- Verbalizes Understanding       Anatomy and Physiology of the Respiratory System Group instruction provided by PowerPoint, verbal discussion, and written material to support subject matter. Instructor reviews respiratory cycle and anatomical components of the respiratory system and their functions. Instructor also reviews differences in obstructive and restrictive respiratory diseases with examples of each.    Oxygen Safety Group instruction provided by PowerPoint, verbal discussion, and written material to support subject matter. There is an overview of "What is Oxygen" and "Why do we need it".  Instructor also reviews how to create a safe environment for oxygen use, the importance of using oxygen as prescribed, and the risks of noncompliance. There is a brief discussion on traveling with oxygen and resources the patient may utilize.   Oxygen Use Group instruction provided by PowerPoint, verbal discussion, and written material to discuss how supplemental oxygen is prescribed and different types of oxygen supply systems. Resources for more information are provided.     Breathing Techniques Group instruction that is supported by demonstration and informational handouts. Instructor discusses the benefits of pursed lip and diaphragmatic breathing and detailed demonstration on how to perform both.     Risk Factor Reduction Group instruction that is supported by a PowerPoint presentation. Instructor discusses the definition of a risk factor, different risk factors for pulmonary disease, and how the heart and lungs work together.   Pulmonary Diseases Group instruction provided by PowerPoint, verbal discussion, and written material to support subject matter. Instructor gives an overview of the different type of pulmonary diseases. There is also a discussion on risk factors and symptoms as well as ways to manage the diseases.   Stress and Energy Conservation Group instruction provided by PowerPoint, verbal discussion, and written material to support subject matter. Instructor gives an overview of stress and the impact it can have on the body. Instructor also reviews ways to reduce stress. There is also a discussion on energy conservation and ways to conserve  energy throughout the day.   Warning Signs and Symptoms Group instruction provided by PowerPoint, verbal discussion, and written material to support subject matter. Instructor reviews warning signs and symptoms of stroke, heart attack, cold and flu. Instructor also reviews ways to prevent the spread of infection.   Other Education Group or individual verbal, written, or video instructions that support the educational goals of the pulmonary rehab program.    Knowledge Questionnaire Score:  Knowledge Questionnaire Score - 12/27/22 1130       Knowledge Questionnaire Score   Pre Score 16/18             Core Components/Risk Factors/Patient Goals at Admission:  Personal Goals and Risk Factors at Admission - 12/27/22 1101       Core Components/Risk Factors/Patient Goals on Admission   Improve  shortness of breath with ADL's Yes    Intervention Provide education, individualized exercise plan and daily activity instruction to help decrease symptoms of SOB with activities of daily living.    Expected Outcomes Short Term: Improve cardiorespiratory fitness to achieve a reduction of symptoms when performing ADLs;Long Term: Be able to perform more ADLs without symptoms or delay the onset of symptoms             Core Components/Risk Factors/Patient Goals Review:   Goals and Risk Factor Review     Row Name 01/06/23 1208             Core Components/Risk Factors/Patient Goals Review   Personal Goals Review Improve shortness of breath with ADL's;Develop more efficient breathing techniques such as purse lipped breathing and diaphragmatic breathing and practicing self-pacing with activity.       Review Unable to assess goals. Manuel [name] has not started the program yet due to an acute illness.       Expected Outcomes For Manuel [name] to improve his shortness of breath with ADLs and develop more efficient breathing techniques such as purse lipped breathing and diaphragmatic breathing and practicing self-pacing with activity.                Core Components/Risk Factors/Patient Goals at Discharge (Final Review):   Goals and Risk Factor Review - 01/06/23 1208       Core Components/Risk Factors/Patient Goals Review   Personal Goals Review Improve shortness of breath with ADL's;Develop more efficient breathing techniques such as purse lipped breathing and diaphragmatic breathing and practicing self-pacing with activity.    Review Unable to assess goals. Manuel [name] has not started the program yet due to an acute illness.    Expected Outcomes For Manuel [name] to improve his shortness of breath with ADLs and develop more efficient breathing techniques such as purse lipped breathing and diaphragmatic breathing and practicing self-pacing with activity.             ITP Comments: Pt is making expected progress toward  Pulmonary Rehab goals after completing 2 session(s). Recommend continued exercise, life style modification, education, and utilization of breathing techniques to increase stamina and strength, while also decreasing shortness of breath with exertion.    Comments: Dr. Mechele Collin is Medical Director for Pulmonary Rehab at [facility].

## 2023-01-14 NOTE — Progress Notes (Signed)
Daily Session Note  Patient Details  Name: Manuel [name] MRN: [id number] Date of Birth: 1960/02/10 Referring Provider:   Doristine Devoid Pulmonary Rehab Walk Test from 12/27/2022 in [facility] for Heart, Vascular, & Lung Health  Referring Provider [name]       Encounter Date: 01/14/2023  Check In:  Session Check In - 01/14/23 1108       Check-In   Supervising physician immediately available to respond to emergencies [facility] MD immediately available    Physician(s) Edd Fabian, NP    Location [facility]    Staff Present Essie Hart, RN, Doris Cheadle, MS, ACSM-CEP, Exercise Physiologist;Casey Katrinka Blazing, RT;Randi Idelle Crouch BS, ACSM-CEP, Exercise Physiologist;Olinty Peggye Pitt, MS, ACSM-CEP, Exercise Physiologist    Virtual Visit No    Medication changes reported     No    Fall or balance concerns reported    No    Tobacco Cessation No Change    Warm-up and Cool-down Performed as group-led instruction    Resistance Training Performed Yes    VAD Patient? No    PAD/SET Patient? No      Pain Assessment   Currently in Pain? No/denies    Multiple Pain Sites No             Capillary Blood Glucose: Results for orders placed or performed during the hospital encounter of 01/14/23 (from the past 24 hours)  Glucose, capillary     Status: Abnormal   Collection Time: 01/14/23 11:12 AM  Result Value Ref Range   Glucose-Capillary 102 (H) 70 - 99 mg/dL     Exercise Prescription Changes - 01/14/23 1200       Response to Exercise   Blood Pressure (Admit) 140/90    Blood Pressure (Exercise) 160/90    Blood Pressure (Exit) 136/80    Heart Rate (Admit) 84 bpm    Heart Rate (Exercise) 99 bpm    Heart Rate (Exit) 93 bpm    Oxygen Saturation (Admit) 96 %    Oxygen Saturation (Exercise) 96 %    Oxygen Saturation (Exit) 96 %    Rating of Perceived Exertion (Exercise) 11    Perceived Dyspnea (Exercise) 1.5    Duration Continue  with 30 min of aerobic exercise without signs/symptoms of physical distress.    Intensity THRR unchanged      Progression   Progression Continue to progress workloads to maintain intensity without signs/symptoms of physical distress.      Resistance Training   Training Prescription Yes    Weight blue bands    Reps 10-15    Time 10 Minutes      Treadmill   MPH 2.5    Grade 0    Minutes 15    METs 2.8      NuStep   Level 2    Minutes 15    METs 2.2             Social History   Tobacco Use  Smoking Status Never  Smokeless Tobacco Never    Goals Met:  Independence with exercise equipment Exercise tolerated well No report of concerns or symptoms today Strength training completed today  Goals Unmet:  Not Applicable  Comments: Service time is from 1015 to 1137    Dr. Mechele Collin is Medical Director for Pulmonary Rehab at [facility].

## 2023-01-16 ENCOUNTER — Encounter (HOSPITAL_COMMUNITY)
Admission: RE | Admit: 2023-01-16 | Discharge: 2023-01-16 | Disposition: A | Discharge status: Home / Self Care | Payer: No Typology Code available for payment source | Source: Ambulatory Visit | Attending: Pulmonary Disease | Admitting: Pulmonary Disease

## 2023-01-16 DIAGNOSIS — R0600 Dyspnea, unspecified: Secondary | ICD-10-CM

## 2023-01-16 NOTE — Progress Notes (Signed)
Daily Session Note  Patient Details  Name: Manuel [name] MRN: [id number] Date of Birth: [birth year] Referring Provider:   Doristine Devoid Pulmonary Rehab Walk Test from 12/27/2022 in [facility] for Heart, Vascular, & Lung Health  Referring Provider [name]       Encounter Date: 01/16/2023  Check In:  Session Check In - 01/16/23 1333       Check-In   Supervising physician immediately available to respond to emergencies [facility] immediately available    Physician(s) Reather Littler, NP    Location [facility]    Staff Present Essie Hart, RN, BSN;Casey Katrinka Blazing, RT;Randi Gonzales BS, ACSM-CEP, Exercise Physiologist    Virtual Visit No    Medication changes reported     No    Fall or balance concerns reported    No    Tobacco Cessation No Change    Warm-up and Cool-down Performed as group-led instruction    Resistance Training Performed Yes    VAD Patient? No    PAD/SET Patient? No      Pain Assessment   Currently in Pain? No/denies    Multiple Pain Sites No             Capillary Blood Glucose: No results found for this or any previous visit (from the past 24 hours).    Social History   Tobacco Use  Smoking Status Never  Smokeless Tobacco Never    Goals Met:  Independence with exercise equipment Exercise tolerated well No report of concerns or symptoms today Strength training completed today  Goals Unmet:  Not Applicable  Comments: Service time is from 1312 to 1439     Dr. Mechele Collin is Medical Director for Pulmonary Rehab at [facility].

## 2023-01-21 ENCOUNTER — Encounter (HOSPITAL_COMMUNITY): Admission: RE | Admit: 2023-01-21 | Payer: No Typology Code available for payment source | Source: Ambulatory Visit

## 2023-01-23 ENCOUNTER — Encounter (HOSPITAL_COMMUNITY)
Admission: RE | Admit: 2023-01-23 | Discharge: 2023-01-23 | Disposition: A | Discharge status: Home / Self Care | Payer: No Typology Code available for payment source | Source: Ambulatory Visit | Attending: Pulmonary Disease | Admitting: Pulmonary Disease

## 2023-01-23 DIAGNOSIS — R0600 Dyspnea, unspecified: Secondary | ICD-10-CM | POA: Diagnosis present

## 2023-01-23 NOTE — Progress Notes (Signed)
 Daily Session Note  Patient Details  Name: Manuel [name] MRN: [id number] Date of Birth: 16-May-1960 Referring Provider:   Conrad Ports Pulmonary Rehab Walk Test from 12/27/2022 in [facility] for Heart, Vascular, & Lung Health  Referring Provider [name]       Encounter Date: 01/23/2023  Check In:  Session Check In - 01/23/23 1400       Check-In   Supervising physician immediately available to respond to emergencies [facility] immediately available    Physician(s) Rosaline Bane, NP    Location [facility]    Staff Present Ronal Levin, RN, BSN;Casey Smith, RT;Randi [place] BS, ACSM-CEP, Exercise Physiologist    Virtual Visit No    Medication changes reported     No    Fall or balance concerns reported    No    Tobacco Cessation No Change    Warm-up and Cool-down Performed as group-led instruction    Resistance Training Performed Yes    VAD Patient? No    PAD/SET Patient? No      Pain Assessment   Currently in Pain? No/denies    Multiple Pain Sites No             Capillary Blood Glucose: No results found for this or any previous visit (from the past 24 hours).    Social History   Tobacco Use  Smoking Status Never  Smokeless Tobacco Never    Goals Met:  Independence with exercise equipment Exercise tolerated well No report of concerns or symptoms today Strength training completed today  Goals Unmet:  Not Applicable  Comments: Service time is from 1310 to 1438    Dr. Slater Staff is Medical Director for Pulmonary Rehab at [facility].

## 2023-01-28 ENCOUNTER — Encounter (HOSPITAL_COMMUNITY)
Admission: RE | Admit: 2023-01-28 | Discharge: 2023-01-28 | Disposition: A | Discharge status: Home / Self Care | Payer: No Typology Code available for payment source | Source: Ambulatory Visit | Attending: Pulmonary Disease | Admitting: Pulmonary Disease

## 2023-01-28 VITALS — Wt 231.7 lb

## 2023-01-28 DIAGNOSIS — R0600 Dyspnea, unspecified: Secondary | ICD-10-CM

## 2023-01-28 LAB — GLUCOSE, CAPILLARY: Glucose-Capillary: 72 mg/dL (ref 70–99)

## 2023-01-28 NOTE — Progress Notes (Signed)
 Daily Session Note  Patient Details  Name: Manuel [name] MRN: [id number] Date of Birth: 02-16-1960 Referring Provider:   Conrad Ports Pulmonary Rehab Walk Test from 12/27/2022 in [facility] for Heart, Vascular, & Lung Health  Referring Provider [name]       Encounter Date: 01/28/2023  Check In:  Session Check In - 01/28/23 1325       Check-In   Supervising physician immediately available to respond to emergencies [facility] immediately available    Physician(s) Rosaline Bane, NP    [facility]    Staff Present Ronal Levin, RN, BSN;Casey Claudene, RT;Randi [facility] BS, ACSM-CEP, Exercise Physiologist;Kaylee Nicholaus, MS, ACSM-CEP, Exercise Physiologist    Virtual Visit No    Medication changes reported     No    Fall or balance concerns reported    No    Tobacco Cessation No Change    Warm-up and Cool-down Performed as group-led instruction    Resistance Training Performed Yes    VAD Patient? No    PAD/SET Patient? No      Pain Assessment   Currently in Pain? No/denies    Pain Score 0-No pain    Multiple Pain Sites No             Capillary Blood Glucose: Results for orders placed or performed during the hospital encounter of 01/28/23 (from the past 24 hours)  Glucose, capillary     Status: None   Collection Time: 01/28/23  2:36 PM  Result Value Ref Range   Glucose-Capillary 72 70 - 99 mg/dL     Exercise Prescription Changes - 01/28/23 1500       Response to Exercise   Blood Pressure (Admit) 124/76    Blood Pressure (Exercise) 144/70    Blood Pressure (Exit) 128/80    Heart Rate (Admit) 90 bpm    Heart Rate (Exercise) 102 bpm    Heart Rate (Exit) 95 bpm    Oxygen Saturation (Admit) 97 %    Oxygen Saturation (Exercise) 95 %    Oxygen Saturation (Exit) 96 %    Rating of Perceived Exertion (Exercise) 11.5    Perceived Dyspnea (Exercise) 2    Duration Continue with 30 min of aerobic exercise  without signs/symptoms of physical distress.    Intensity THRR unchanged      Progression   Progression Continue to progress workloads to maintain intensity without signs/symptoms of physical distress.      Resistance Training   Training Prescription Yes    Weight blue bands    Reps 10-15    Time 10 Minutes      Treadmill   MPH 2.5    Grade 0    Minutes 15    METs 2.91      NuStep   Level 3    SPM 108    Minutes 15    METs 3.1             Social History   Tobacco Use  Smoking Status Never  Smokeless Tobacco Never    Goals Met:  Independence with exercise equipment Exercise tolerated well No report of concerns or symptoms today Strength training completed today  Goals Unmet:  Not Applicable  Comments: Service time is from 1306 to 1432    Dr. Slater Staff is Medical Director for Pulmonary Rehab at [facility].

## 2023-01-30 ENCOUNTER — Encounter (HOSPITAL_COMMUNITY): Payer: No Typology Code available for payment source

## 2023-01-30 ENCOUNTER — Other Ambulatory Visit (HOSPITAL_COMMUNITY): Payer: Self-pay | Admitting: Neurological Surgery

## 2023-01-30 DIAGNOSIS — M542 Cervicalgia: Secondary | ICD-10-CM

## 2023-02-04 ENCOUNTER — Encounter (HOSPITAL_COMMUNITY)
Admission: RE | Admit: 2023-02-04 | Discharge: 2023-02-04 | Disposition: A | Discharge status: Home / Self Care | Payer: No Typology Code available for payment source | Source: Ambulatory Visit | Attending: Pulmonary Disease | Admitting: Pulmonary Disease

## 2023-02-04 VITALS — Wt 235.5 lb

## 2023-02-04 DIAGNOSIS — R0600 Dyspnea, unspecified: Secondary | ICD-10-CM

## 2023-02-04 NOTE — Progress Notes (Signed)
 Daily Session Note  Patient Details  Name: Manuel [name] MRN: [id number] Date of Birth: 06-04-1960 Referring Provider:   Conrad Ports Pulmonary Rehab Walk Test from 12/27/2022 in [facility] for Heart, Vascular, & Lung Health  Referring Provider [name]       Encounter Date: 02/04/2023  Check In:  Session Check In - 02/04/23 1413       Check-In   Supervising physician immediately available to respond to emergencies [facility] immediately available    Physician(s) Lamarr Satterfield, NP    Location [facility]    Staff Present Ronal Levin, RN, BSN;Casey Claudene, RT;Randi [facility] BS, ACSM-CEP, Exercise Physiologist;Coron Rossano Nicholaus, MS, ACSM-CEP, Exercise Physiologist    Virtual Visit No    Medication changes reported     No    Fall or balance concerns reported    No    Tobacco Cessation No Change    Warm-up and Cool-down Performed as group-led instruction    Resistance Training Performed Yes    VAD Patient? No    PAD/SET Patient? No      Pain Assessment   Currently in Pain? No/denies    Multiple Pain Sites No             Capillary Blood Glucose: No results found for this or any previous visit (from the past 24 hours).    Social History   Tobacco Use  Smoking Status Never  Smokeless Tobacco Never    Goals Met:  Proper associated with RPD/PD & O2 Sat Exercise tolerated well Strength training completed today  Goals Unmet:  Not Applicable  Comments: Service time is from 1314 to 1430. Pt has CBG of 63. He took 4 glucose tabs that were 4 grams each. CBG increased to 119. No report of signs of symptoms   Dr. Slater Staff is Medical Director for Pulmonary Rehab at [facility].

## 2023-02-06 ENCOUNTER — Encounter (HOSPITAL_COMMUNITY): Payer: No Typology Code available for payment source

## 2023-02-06 ENCOUNTER — Telehealth (HOSPITAL_COMMUNITY): Payer: Self-pay | Admitting: *Deleted

## 2023-02-06 NOTE — Telephone Encounter (Signed)
Pt called to report he is having significant back pain and will not be able to exercise in Pulmonary Rehab today. Will cancel appt.  Ethelda Chick BS, ACSM-CEP 02/06/2023 9:57 AM

## 2023-02-07 ENCOUNTER — Ambulatory Visit (HOSPITAL_COMMUNITY)
Admission: RE | Admit: 2023-02-07 | Discharge: 2023-02-07 | Disposition: A | Discharge status: Home / Self Care | Payer: No Typology Code available for payment source | Source: Ambulatory Visit | Attending: Neurological Surgery | Admitting: Neurological Surgery

## 2023-02-07 DIAGNOSIS — M542 Cervicalgia: Secondary | ICD-10-CM | POA: Diagnosis present

## 2023-02-11 ENCOUNTER — Encounter (HOSPITAL_COMMUNITY)
Admission: RE | Admit: 2023-02-11 | Discharge: 2023-02-11 | Disposition: A | Discharge status: Home / Self Care | Payer: No Typology Code available for payment source | Source: Ambulatory Visit | Attending: Pulmonary Disease | Admitting: Pulmonary Disease

## 2023-02-11 ENCOUNTER — Telehealth (HOSPITAL_COMMUNITY): Payer: Self-pay | Admitting: *Deleted

## 2023-02-11 NOTE — Telephone Encounter (Signed)
Manuel [name] called to say he would be absent from pulmonary rehab today due to back issues. He also, let us know he reached out to his PCP at the [facility] regarding low blood sugar, which he's concerned about and he's waiting to here back from them regarding this matter. Will forward message to his nurse case manager.

## 2023-02-12 NOTE — Progress Notes (Addendum)
Pulmonary Individual Treatment Plan  Patient Details  Name: Manuel [name] MRN: [id number] Date of Birth: 05/29/1960 Referring Provider:   Doristine Devoid Pulmonary Rehab Walk Test from 12/27/2022 in [facility]  Referring Provider [name]       Initial Encounter Date:  Flowsheet Row Pulmonary Rehab Walk Test from 12/27/2022 in [facility]  Date 12/27/22       Visit Diagnosis: Dyspnea  Patient's Home Medications on Admission:   Current Outpatient Medications:    albuterol (VENTOLIN HFA) 108 (90 Base) MCG/ACT inhaler, Inhale 2 puffs into the lungs every 6 (six) hours as needed for wheezing or shortness of breath., Disp: , Rfl:    amLODipine (NORVASC) 10 MG tablet, Take 10 mg by mouth daily., Disp: , Rfl:    atorvastatin (LIPITOR) 40 MG tablet, Take 40 mg by mouth daily., Disp: , Rfl:    cephALEXin (KEFLEX) 500 MG capsule, Take 1 capsule (500 mg total) by mouth every 6 (six) hours. (Patient not taking: Reported on 12/27/2022), Disp: 36 capsule, Rfl: 0   cetirizine (ZYRTEC) 10 MG tablet, Take 10 mg by mouth at bedtime., Disp: , Rfl:    cholecalciferol (VITAMIN D3) 25 MCG (1000 UNIT) tablet, Take 1,000 Units by mouth daily., Disp: , Rfl:    cyclobenzaprine (FLEXERIL) 10 MG tablet, Take 1 tablet (10 mg total) by mouth 3 (three) times daily as needed for muscle spasms., Disp: 90 tablet, Rfl: 1   empagliflozin (JARDIANCE) 25 MG TABS tablet, Take 12.5 mg by mouth daily., Disp: , Rfl:    famotidine (PEPCID) 20 MG tablet, Take 20 mg by mouth daily as needed for heartburn or indigestion., Disp: , Rfl:    fluticasone (FLONASE) 50 MCG/ACT nasal spray, Place 2 sprays into both nostrils daily as needed for allergies or rhinitis., Disp: , Rfl:    gabapentin (NEURONTIN) 100 MG capsule, Take 100 mg by mouth 3 (three) times daily as needed (pain)., Disp: , Rfl:     isosorbide mononitrate (IMDUR) 30 MG 24 hr tablet, Take 30 mg by mouth daily., Disp: , Rfl:    losartan (COZAAR) 50 MG tablet, Take 50 mg by mouth daily., Disp: , Rfl:    meloxicam (MOBIC) 15 MG tablet, Take 15 mg by mouth daily as needed for pain., Disp: , Rfl:    metFORMIN (GLUCOPHAGE-XR) 500 MG 24 hr tablet, Take 500 mg by mouth 2 (two) times daily., Disp: , Rfl:    metoprolol tartrate (LOPRESSOR) 25 MG tablet, Take 12.5 mg by mouth 2 (two) times daily., Disp: , Rfl:    Mometasone Furoate (ASMANEX HFA) 100 MCG/ACT AERO, Inhale 2 puffs into the lungs at bedtime., Disp: , Rfl:    montelukast (SINGULAIR) 10 MG tablet, Take 10 mg by mouth at bedtime., Disp: , Rfl:    Oxycodone HCl 10 MG TABS, Take 1 tablet (10 mg total) by mouth every 4 (four) hours as needed ((score 7 to 10)). (Patient not taking: Reported on 12/27/2022), Disp: 40 tablet, Rfl: 0   spironolactone (ALDACTONE) 25 MG tablet, Take 12.5 mg by mouth daily., Disp: , Rfl:    Tiotropium Bromide-Olodaterol (STIOLTO RESPIMAT) 2.5-2.5 MCG/ACT AERS, Inhale 2 each into the lungs daily., Disp: , Rfl:   Past Medical History: Past Medical History:  Diagnosis Date   Arthritis    Asthma    CHF (congestive heart failure) (HCC)    Diabetes mellitus without complication (  HCC)    Dyspnea    GERD (gastroesophageal reflux disease)    HLD (hyperlipidemia)    Hypertension    Pneumonia    1981   Seasonal allergies     Tobacco Use: Social History   Tobacco Use  Smoking Status Never  Smokeless Tobacco Never    Labs: Review Flowsheet       Latest Ref Rng & Units 07/16/2021  Labs for ITP Cardiac and Pulmonary Rehab  Hemoglobin A1c 4.8 - 5.6 % 10.2     Capillary Blood Glucose: Lab Results  Component Value Date   GLUCAP 72 01/28/2023   GLUCAP 102 (H) 01/14/2023   GLUCAP 145 (H) 01/14/2023   GLUCAP 96 01/09/2023   GLUCAP 101 (H) 01/09/2023     Pulmonary Assessment Scores:  Pulmonary Assessment Scores     Row Name 12/27/22 1058          ADL UCSD   ADL Phase Entry     SOB Score total 28       CAT Score   CAT Score 10       mMRC Score   mMRC Score 3             UCSD: Self-administered rating of dyspnea associated with activities of daily living (ADLs) 6-point scale (0 = "not at all" to 5 = "maximal or unable to do because of breathlessness")  Scoring Scores range from 0 to 120.  Minimally important difference is 5 units  CAT: CAT can identify the health impairment of COPD patients and is better correlated with disease progression.  CAT has a scoring range of zero to 40. The CAT score is classified into four groups of low (less than 10), medium (10 - 20), high (21-30) and very high (31-40) based on the impact level of disease on health status. A CAT score over 10 suggests significant symptoms.  A worsening CAT score could be explained by an exacerbation, poor medication adherence, poor inhaler technique, or progression of COPD or comorbid conditions.  CAT MCID is 2 points  mMRC: mMRC (Modified Medical Research Council) Dyspnea Scale is used to assess the degree of baseline functional disability in patients of respiratory disease due to dyspnea. No minimal important difference is established. A decrease in score of 1 point or greater is considered a positive change.   Pulmonary Function Assessment:  Pulmonary Function Assessment - 12/27/22 1058       Breath   Bilateral Breath Sounds Rhonchi;Wheezes;Expiratory;Decreased   BLU rhonchi, RLL exp wheezes, diminished throughout   Shortness of Breath Yes;Limiting activity             Exercise Target Goals: Exercise Program Goal: Individual exercise prescription set using results from initial 6 min walk test and THRR while considering  patient's activity barriers and safety.   Exercise Prescription Goal: Initial exercise prescription builds to 30-45 minutes a day of aerobic activity, 2-3 days per week.  Home exercise guidelines will be given to patient  during program as part of exercise prescription that the participant will acknowledge.  Activity Barriers & Risk Stratification:  Activity Barriers & Cardiac Risk Stratification - 12/27/22 1054       Activity Barriers & Cardiac Risk Stratification   Activity Barriers Muscular Weakness;Deconditioning;Shortness of Breath;Neck/Spine Problems             6 Minute Walk:  6 Minute Walk     Row Name 12/27/22 1136         6 Minute Walk  Phase Initial     Distance 1260 feet     Walk Time 6 minutes     # of Rest Breaks 0     MPH 2.39     METS 3.12     RPE 12     Perceived Dyspnea  1     VO2 Peak 10.94     Symptoms No     Resting HR 75 bpm     Resting BP 134/80     Resting Oxygen Saturation  96 %     Exercise Oxygen Saturation  during 6 min walk 92 %     Max Ex. HR 107 bpm     Max Ex. BP 142/70     2 Minute Post BP 132/70       Interval HR   1 Minute HR 94     2 Minute HR 100     3 Minute HR 100     4 Minute HR 107     5 Minute HR 103     6 Minute HR 104     2 Minute Post HR 80     Interval Heart Rate? Yes       Interval Oxygen   Interval Oxygen? Yes     Baseline Oxygen Saturation % 96 %     1 Minute Oxygen Saturation % 96 %     1 Minute Liters of Oxygen 0 L     2 Minute Oxygen Saturation % 92 %     2 Minute Liters of Oxygen 0 L     3 Minute Oxygen Saturation % 94 %     3 Minute Liters of Oxygen 0 L     4 Minute Oxygen Saturation % 94 %     4 Minute Liters of Oxygen 0 L     5 Minute Oxygen Saturation % 94 %     5 Minute Liters of Oxygen 0 L     6 Minute Oxygen Saturation % 94 %     6 Minute Liters of Oxygen 0 L     2 Minute Post Oxygen Saturation % 97 %     2 Minute Post Liters of Oxygen 0 L              Oxygen Initial Assessment:  Oxygen Initial Assessment - 12/27/22 1055       Home Oxygen   Home Oxygen Device None    Sleep Oxygen Prescription None    Home Exercise Oxygen Prescription None    Home Resting Oxygen Prescription None       Initial 6 min Walk   Oxygen Used None      Program Oxygen Prescription   Program Oxygen Prescription None      Intervention   Short Term Goals To learn and understand importance of maintaining oxygen saturations>88%;To learn and understand importance of monitoring SPO2 with pulse oximeter and demonstrate accurate use of the pulse oximeter.;To learn and demonstrate proper pursed lip breathing techniques or other breathing techniques. ;To learn and demonstrate proper use of respiratory medications    Long  Term Goals Verbalizes importance of monitoring SPO2 with pulse oximeter and return demonstration;Maintenance of O2 saturations>88%;Exhibits proper breathing techniques, such as pursed lip breathing or other method taught during program session;Compliance with respiratory medication             Oxygen Re-Evaluation:  Oxygen Re-Evaluation     Row Name 12/27/22 1055 01/31/23 1210  Program Oxygen Prescription   Program Oxygen Prescription -- None        Home Oxygen   Home Oxygen Device -- None      Sleep Oxygen Prescription -- None      Home Exercise Oxygen Prescription -- None      Home Resting Oxygen Prescription -- None        Goals/Expected Outcomes   Short Term Goals -- To learn and understand importance of maintaining oxygen saturations>88%;To learn and understand importance of monitoring SPO2 with pulse oximeter and demonstrate accurate use of the pulse oximeter.;To learn and demonstrate proper pursed lip breathing techniques or other breathing techniques. ;To learn and demonstrate proper use of respiratory medications      Long  Term Goals -- Verbalizes importance of monitoring SPO2 with pulse oximeter and return demonstration;Maintenance of O2 saturations>88%;Exhibits proper breathing techniques, such as pursed lip breathing or other method taught during program session;Compliance with respiratory medication      Goals/Expected Outcomes Compliance and understanding  of oxygen saturation monitoring and breathing techniques to decrease shortness of breath Compliance and understanding of oxygen saturation monitoring and breathing techniques to decrease shortness of breath               Oxygen Discharge (Final Oxygen Re-Evaluation):  Oxygen Re-Evaluation - 01/31/23 1210       Program Oxygen Prescription   Program Oxygen Prescription None      Home Oxygen   Home Oxygen Device None    Sleep Oxygen Prescription None    Home Exercise Oxygen Prescription None    Home Resting Oxygen Prescription None      Goals/Expected Outcomes   Short Term Goals To learn and understand importance of maintaining oxygen saturations>88%;To learn and understand importance of monitoring SPO2 with pulse oximeter and demonstrate accurate use of the pulse oximeter.;To learn and demonstrate proper pursed lip breathing techniques or other breathing techniques. ;To learn and demonstrate proper use of respiratory medications    Long  Term Goals Verbalizes importance of monitoring SPO2 with pulse oximeter and return demonstration;Maintenance of O2 saturations>88%;Exhibits proper breathing techniques, such as pursed lip breathing or other method taught during program session;Compliance with respiratory medication    Goals/Expected Outcomes Compliance and understanding of oxygen saturation monitoring and breathing techniques to decrease shortness of breath             Initial Exercise Prescription:  Initial Exercise Prescription - 12/27/22 1100       Date of Initial Exercise RX and Referring Provider   Date 12/27/22    Referring Provider Vivia Budge   Expected Discharge Date 03/25/23      Treadmill   MPH 2.2    Grade 0    Minutes 15      NuStep   Level 2    SPM 60    Minutes 15    METs 2      Prescription Details   Frequency (times per week) 2    Duration Progress to 30 minutes of continuous aerobic without signs/symptoms of physical distress       Intensity   THRR 40-80% of Max Heartrate 64-128    Ratings of Perceived Exertion 11-13    Perceived Dyspnea 0-4      Progression   Progression Continue to progress workloads to maintain intensity without signs/symptoms of physical distress.      Resistance Training   Training Prescription Yes    Weight blue bands    Reps 10-15  Perform Capillary Blood Glucose checks as needed.  Exercise Prescription Changes:   Exercise Prescription Changes     Row Name 01/14/23 1200 01/28/23 1500 02/04/23 1425         Response to Exercise   Blood Pressure (Admit) 140/90 124/76 122/80     Blood Pressure (Exercise) 160/90 144/70 --     Blood Pressure (Exit) 136/80 128/80 124/88     Heart Rate (Admit) 84 bpm 90 bpm 96 bpm     Heart Rate (Exercise) 99 bpm 102 bpm 114 bpm     Heart Rate (Exit) 93 bpm 95 bpm 106 bpm     Oxygen Saturation (Admit) 96 % 97 % 97 %     Oxygen Saturation (Exercise) 96 % 95 % 94 %     Oxygen Saturation (Exit) 96 % 96 % 96 %     Rating of Perceived Exertion (Exercise) 11 11.5 11     Perceived Dyspnea (Exercise) 1.5 2 2      Duration Continue with 30 min of aerobic exercise without signs/symptoms of physical distress. Continue with 30 min of aerobic exercise without signs/symptoms of physical distress. Continue with 30 min of aerobic exercise without signs/symptoms of physical distress.     Intensity THRR unchanged THRR unchanged THRR unchanged       Progression   Progression Continue to progress workloads to maintain intensity without signs/symptoms of physical distress. Continue to progress workloads to maintain intensity without signs/symptoms of physical distress. Continue to progress workloads to maintain intensity without signs/symptoms of physical distress.       Resistance Training   Training Prescription Yes Yes Yes     Weight blue bands blue bands blue bands     Reps 10-15 10-15 10-15     Time 10 Minutes 10 Minutes 10 Minutes       Treadmill    MPH 2.5 2.5 2.5     Grade 0 0 0     Minutes 15 15 15      METs 2.8 2.91 2.7       NuStep   Level 2 3 3      SPM -- 108 111     Minutes 15 15 15      METs 2.2 3.1 3.2              Exercise Comments:   Exercise Comments     Row Name 01/31/23 1205           Exercise Comments Pt completed his first day of group exercise. Began on the treadmill at speed 2.0 mph, 0 incline, for 15 min. He then exercised on the recumbent stepper for 15 min, level 2, METS 2.0. Performed squats.                Exercise Goals and Review:   Exercise Goals     Row Name 12/27/22 1054             Exercise Goals   Increase Physical Activity Yes       Intervention Provide advice, education, support and counseling about physical activity/exercise needs.;Develop an individualized exercise prescription for aerobic and resistive training based on initial evaluation findings, risk stratification, comorbidities and participant's personal goals.       Expected Outcomes Short Term: Attend rehab on a regular basis to increase amount of physical activity.;Long Term: Add in home exercise to make exercise part of routine and to increase amount of physical activity.;Long Term: Exercising regularly at least 3-5 days a week.  Increase Strength and Stamina Yes       Intervention Provide advice, education, support and counseling about physical activity/exercise needs.;Develop an individualized exercise prescription for aerobic and resistive training based on initial evaluation findings, risk stratification, comorbidities and participant's personal goals.       Expected Outcomes Short Term: Increase workloads from initial exercise prescription for resistance, speed, and METs.;Short Term: Perform resistance training exercises routinely during rehab and add in resistance training at home;Long Term: Improve cardiorespiratory fitness, muscular endurance and strength as measured by increased METs and functional  capacity ( )       Able to understand and use rate of perceived exertion (RPE) scale Yes       Intervention Provide education and explanation on how to use RPE scale       Expected Outcomes Short Term: Able to use RPE daily in rehab to express subjective intensity level;Long Term:  Able to use RPE to guide intensity level when exercising independently       Able to understand and use Dyspnea scale Yes       Intervention Provide education and explanation on how to use Dyspnea scale       Expected Outcomes Short Term: Able to use Dyspnea scale daily in rehab to express subjective sense of shortness of breath during exertion;Long Term: Able to use Dyspnea scale to guide intensity level when exercising independently       Knowledge and understanding of Target Heart Rate Range (THRR) Yes       Intervention Provide education and explanation of THRR including how the numbers were predicted and where they are located for reference       Expected Outcomes Short Term: Able to state/look up THRR;Short Term: Able to use daily as guideline for intensity in rehab;Long Term: Able to use THRR to govern intensity when exercising independently       Understanding of Exercise Prescription Yes       Intervention Provide education, explanation, and written materials on patient's individual exercise prescription       Expected Outcomes Short Term: Able to explain program exercise prescription;Long Term: Able to explain home exercise prescription to exercise independently                Exercise Goals Re-Evaluation :  Exercise Goals Re-Evaluation     Row Name 01/02/23 0830 01/31/23 1156           Exercise Goal Re-Evaluation   Exercise Goals Review Increase Physical Activity;Able to understand and use Dyspnea scale;Understanding of Exercise Prescription;Increase Strength and Stamina;Knowledge and understanding of Target Heart Rate Range (THRR);Able to understand and use rate of perceived exertion (RPE) scale  Increase Physical Activity;Able to understand and use Dyspnea scale;Understanding of Exercise Prescription;Increase Strength and Stamina;Knowledge and understanding of Target Heart Rate Range (THRR);Able to understand and use rate of perceived exertion (RPE) scale      Comments Pt scheduled to begin exercise 12/12. Will monitor and progress as tolerated. Pt has completed 6 days of group exercise. He is walking on the treadmill for 15 min, now speed 2.5, incline 0, METs 2.8. He then is exercising on the recumbent stepper for 15 min, level 3, METS 3.1. Pt is progressing appropriately. He is performing warm up and cool down independently with modifications for his cervical disease. Uses blue bands.  Performs squats. Will monitor and progress as tolerated.      Expected Outcomes Through exercise at rehab and home, the patient will decrease shortness of breath with  daily activities and feel confident in carrying out an exercise regimen at home Through exercise at rehab and home, the patient will decrease shortness of breath with daily activities and feel confident in carrying out an exercise regimen at home               Discharge Exercise Prescription (Final Exercise Prescription Changes):  Exercise Prescription Changes - 02/04/23 1425       Response to Exercise   Blood Pressure (Admit) 122/80    Blood Pressure (Exit) 124/88    Heart Rate (Admit) 96 bpm    Heart Rate (Exercise) 114 bpm    Heart Rate (Exit) 106 bpm    Oxygen Saturation (Admit) 97 %    Oxygen Saturation (Exercise) 94 %    Oxygen Saturation (Exit) 96 %    Rating of Perceived Exertion (Exercise) 11    Perceived Dyspnea (Exercise) 2    Duration Continue with 30 min of aerobic exercise without signs/symptoms of physical distress.    Intensity THRR unchanged      Progression   Progression Continue to progress workloads to maintain intensity without signs/symptoms of physical distress.      Resistance Training   Training  Prescription Yes    Weight blue bands    Reps 10-15    Time 10 Minutes      Treadmill   MPH 2.5    Grade 0    Minutes 15    METs 2.7      NuStep   Level 3    SPM 111    Minutes 15    METs 3.2             Nutrition:  Target Goals: Understanding of nutrition guidelines, daily intake of sodium 1500mg , cholesterol 200mg , calories 30% from fat and 7% or less from saturated fats, daily to have 5 or more servings of fruits and vegetables.  Biometrics:    Nutrition Therapy Plan and Nutrition Goals:   Nutrition Assessments:  MEDIFICTS Score Key: >=70 Need to make dietary changes  40-70 Heart Healthy Diet <= 40 Therapeutic Level Cholesterol Diet   Picture Your Plate Scores: <57 Unhealthy dietary pattern with much room for improvement. 41-50 Dietary pattern unlikely to meet recommendations for good health and room for improvement. 51-60 More healthful dietary pattern, with some room for improvement.  >60 Healthy dietary pattern, although there may be some specific behaviors that could be improved.    Nutrition Goals Re-Evaluation:   Nutrition Goals Discharge (Final Nutrition Goals Re-Evaluation):   Psychosocial: Target Goals: Acknowledge presence or absence of significant depression and/or stress, maximize coping skills, provide positive support system. Participant is able to verbalize types and ability to use techniques and skills needed for reducing stress and depression.  Initial Review & Psychosocial Screening:  Initial Psych Review & Screening - 12/27/22 1056       Initial Review   Current issues with --   denies psy/soc barriers, states he worries about grandson with autism     Family Dynamics   Good Support System? Yes    Comments significant other Porfirio Mylar, and 2 grown daughters      Barriers   Psychosocial barriers to participate in program There are no identifiable barriers or psychosocial needs.      Screening Interventions   Interventions  Encouraged to exercise;Provide feedback about the scores to participant    Expected Outcomes Long Term Goal: Stressors or current issues are controlled or eliminated.  Quality of Life Scores:  Scores of 19 and below usually indicate a poorer quality of life in these areas.  A difference of  2-3 points is a clinically meaningful difference.  A difference of 2-3 points in the total score of the Quality of Life Index has been associated with significant improvement in overall quality of life, self-image, physical symptoms, and general health in studies assessing change in quality of life.  PHQ-9: Review Flowsheet       12/27/2022  Depression screen PHQ 2/9  Decreased Interest 0  Down, Depressed, Hopeless 0  PHQ - 2 Score 0  Altered sleeping 0  Tired, decreased energy 0  Change in appetite 0  Feeling bad or failure about yourself  0  Trouble concentrating 0  Moving slowly or fidgety/restless 0  Suicidal thoughts 0  PHQ-9 Score 0   Interpretation of Total Score  Total Score Depression Severity:  1-4 = Minimal depression, 5-9 = Mild depression, 10-14 = Moderate depression, 15-19 = Moderately severe depression, 20-27 = Severe depression   Psychosocial Evaluation and Intervention:  Psychosocial Evaluation - 12/27/22 1100       Psychosocial Evaluation & Interventions   Interventions Encouraged to exercise with the program and follow exercise prescription    Comments Manuel [name] denies psy/soc barriers or concerns at this time. He states that he worries about the future of his grandson with autism.    Expected Outcomes For Manuel [name] to participate in PR free of any psy/soc barriers or concerns    Continue Psychosocial Services  No Follow up required             Psychosocial Re-Evaluation:  Psychosocial Re-Evaluation     Row Name 01/06/23 1206 02/07/23 1505           Psychosocial Re-Evaluation   Current issues with None Identified None Identified      Comments Unable to  assess any changes. Manuel [name] has not started the program yet. He has been ill. He will hopefully come this week. Psychosocial monthly re-evaluation is as follows: Manuel [name] still denies any needs or barriers at this time. He enjoys coming to rehab when he can but also struggles with uncontrolled blood sugars and chronic pain. He is working with his MDs at the [place].      Expected Outcomes For Manuel [name] to participate in PR free of any psy/soc barriers or concerns. For Manuel [name] to participate in PR free of any psy/soc barriers or concerns.      Interventions Encouraged to attend Pulmonary Rehabilitation for the exercise Encouraged to attend Pulmonary Rehabilitation for the exercise      Continue Psychosocial Services  No Follow up required No Follow up required               Psychosocial Discharge (Final Psychosocial Re-Evaluation):  Psychosocial Re-Evaluation - 02/07/23 1505       Psychosocial Re-Evaluation   Current issues with None Identified    Comments Psychosocial monthly re-evaluation is as follows: Manuel [name] still denies any needs or barriers at this time. He enjoys coming to rehab when he can but also struggles with uncontrolled blood sugars and chronic pain. He is working with his MDs at the [place].    Expected Outcomes For Manuel [name] to participate in PR free of any psy/soc barriers or concerns.    Interventions Encouraged to attend Pulmonary Rehabilitation for the exercise    Continue Psychosocial Services  No Follow up required  Education: Education Goals: Education classes will be provided on a weekly basis, covering required topics. Participant will state understanding/return demonstration of topics presented.  Learning Barriers/Preferences:  Learning Barriers/Preferences - 12/27/22 1129       Learning Barriers/Preferences   Learning Barriers Exercise Concerns   due to neck and back problems   Learning Preferences Group Instruction;Verbal Instruction;Written Material             Education  Topics: Know Your Numbers Group instruction that is supported by a PowerPoint presentation. Instructor discusses importance of knowing and understanding resting, exercise, and post-exercise oxygen saturation, heart rate, and blood pressure. Oxygen saturation, heart rate, blood pressure, rating of perceived exertion, and dyspnea are reviewed along with a normal range for these values.  Flowsheet Row PULMONARY REHAB OTHER RESPIRATORY from 01/23/2023 in [facility] for Heart, Vascular, & Lung Health  Date 01/23/23  Educator EP  Instruction Review Code 1- Verbalizes Understanding       Exercise for the Pulmonary Patient Group instruction that is supported by a PowerPoint presentation. Instructor discusses benefits of exercise, core components of exercise, frequency, duration, and intensity of an exercise routine, importance of utilizing pulse oximetry during exercise, safety while exercising, and options of places to exercise outside of rehab.  Flowsheet Row PULMONARY REHAB OTHER RESPIRATORY from 01/16/2023 in [facility] for Heart, Vascular, & Lung Health  Date 01/16/23  Educator EP  Instruction Review Code 1- Verbalizes Understanding       MET Level  Group instruction provided by PowerPoint, verbal discussion, and written material to support subject matter. Instructor reviews what METs are and how to increase METs.    Pulmonary Medications Verbally interactive group education provided by instructor with focus on inhaled medications and proper administration. Flowsheet Row PULMONARY REHAB OTHER RESPIRATORY from 01/09/2023 in [facility] for Heart, Vascular, & Lung Health  Date 01/09/23  Educator RT  Instruction Review Code 1- Verbalizes Understanding       Anatomy and Physiology of the Respiratory System Group instruction provided by PowerPoint, verbal discussion, and written material to support subject matter.  Instructor reviews respiratory cycle and anatomical components of the respiratory system and their functions. Instructor also reviews differences in obstructive and restrictive respiratory diseases with examples of each.    Oxygen Safety Group instruction provided by PowerPoint, verbal discussion, and written material to support subject matter. There is an overview of "What is Oxygen" and "Why do we need it".  Instructor also reviews how to create a safe environment for oxygen use, the importance of using oxygen as prescribed, and the risks of noncompliance. There is a brief discussion on traveling with oxygen and resources the patient may utilize.   Oxygen Use Group instruction provided by PowerPoint, verbal discussion, and written material to discuss how supplemental oxygen is prescribed and different types of oxygen supply systems. Resources for more information are provided.    Breathing Techniques Group instruction that is supported by demonstration and informational handouts. Instructor discusses the benefits of pursed lip and diaphragmatic breathing and detailed demonstration on how to perform both.     Risk Factor Reduction Group instruction that is supported by a PowerPoint presentation. Instructor discusses the definition of a risk factor, different risk factors for pulmonary disease, and how the heart and lungs work together.   Pulmonary Diseases Group instruction provided by PowerPoint, verbal discussion, and written material to support subject matter. Instructor gives an overview of the different type of  pulmonary diseases. There is also a discussion on risk factors and symptoms as well as ways to manage the diseases.   Stress and Energy Conservation Group instruction provided by PowerPoint, verbal discussion, and written material to support subject matter. Instructor gives an overview of stress and the impact it can have on the body. Instructor also reviews ways to reduce  stress. There is also a discussion on energy conservation and ways to conserve energy throughout the day.   Warning Signs and Symptoms Group instruction provided by PowerPoint, verbal discussion, and written material to support subject matter. Instructor reviews warning signs and symptoms of stroke, heart attack, cold and flu. Instructor also reviews ways to prevent the spread of infection.   Other Education Group or individual verbal, written, or video instructions that support the educational goals of the pulmonary rehab program.    Knowledge Questionnaire Score:  Knowledge Questionnaire Score - 12/27/22 1130       Knowledge Questionnaire Score   Pre Score 16/18             Core Components/Risk Factors/Patient Goals at Admission:  Personal Goals and Risk Factors at Admission - 12/27/22 1101       Core Components/Risk Factors/Patient Goals on Admission   Improve shortness of breath with ADL's Yes    Intervention Provide education, individualized exercise plan and daily activity instruction to help decrease symptoms of SOB with activities of daily living.    Expected Outcomes Short Term: Improve cardiorespiratory fitness to achieve a reduction of symptoms when performing ADLs;Long Term: Be able to perform more ADLs without symptoms or delay the onset of symptoms             Core Components/Risk Factors/Patient Goals Review:   Goals and Risk Factor Review     Row Name 01/06/23 1208 02/07/23 1507           Core Components/Risk Factors/Patient Goals Review   Personal Goals Review Improve shortness of breath with ADL's;Develop more efficient breathing techniques such as purse lipped breathing and diaphragmatic breathing and practicing self-pacing with activity. Improve shortness of breath with ADL's;Develop more efficient breathing techniques such as purse lipped breathing and diaphragmatic breathing and practicing self-pacing with activity.      Review Unable to assess  goals. Manuel [name] has not started the program yet due to an acute illness. Core components/risk factors/patient goals monthly re-evaluation are as follows: Goal in progress on improving SOB with ADLs and on developing more efficient breathing techniques such as purse lipped breathing and diaphragmatic breathing; and practicing self-pacing with activity. Manuel [name] has attended 7 classes so far and has maintained his oxygen saturation on room air. He is exercising on the treadmill and the NuStep. He has been able to increase his workload and METs on the NuStep. Manuel [name] is motivated to make progress. He will continue to benefit from participation in PR for nutrition, education, exercise, and lifestyle modification.      Expected Outcomes For Manuel [name] to improve his shortness of breath with ADLs and develop more efficient breathing techniques such as purse lipped breathing and diaphragmatic breathing and practicing self-pacing with activity. For Manuel [name] to improve his shortness of breath with ADLs and develop more efficient breathing techniques such as purse lipped breathing and diaphragmatic breathing and practicing self-pacing with activity.               Core Components/Risk Factors/Patient Goals at Discharge (Final Review):   Goals and Risk Factor Review - 02/07/23 1507  Core Components/Risk Factors/Patient Goals Review   Personal Goals Review Improve shortness of breath with ADL's;Develop more efficient breathing techniques such as purse lipped breathing and diaphragmatic breathing and practicing self-pacing with activity.    Review Core components/risk factors/patient goals monthly re-evaluation are as follows: Goal in progress on improving SOB with ADLs and on developing more efficient breathing techniques such as purse lipped breathing and diaphragmatic breathing; and practicing self-pacing with activity. Manuel [name] has attended 7 classes so far and has maintained his oxygen saturation on room air. He is exercising on the  treadmill and the NuStep. He has been able to increase his workload and METs on the NuStep. Manuel [name] is motivated to make progress. He will continue to benefit from participation in PR for nutrition, education, exercise, and lifestyle modification.    Expected Outcomes For Manuel [name] to improve his shortness of breath with ADLs and develop more efficient breathing techniques such as purse lipped breathing and diaphragmatic breathing and practicing self-pacing with activity.             ITP Comments:Pt is making expected progress toward Pulmonary Rehab goals after completing 7 session(s). Recommend continued exercise, life style modification, education, and utilization of breathing techniques to increase stamina and strength, while also decreasing shortness of breath with exertion.  Dr. Mechele Collin is Medical Director for Pulmonary Rehab at [facility].

## 2023-02-13 ENCOUNTER — Encounter (HOSPITAL_COMMUNITY): Payer: No Typology Code available for payment source

## 2023-02-18 ENCOUNTER — Encounter (HOSPITAL_COMMUNITY)
Admission: RE | Admit: 2023-02-18 | Discharge: 2023-02-18 | Disposition: A | Discharge status: Home / Self Care | Payer: No Typology Code available for payment source | Source: Ambulatory Visit | Attending: Pulmonary Disease | Admitting: Pulmonary Disease

## 2023-02-18 NOTE — Progress Notes (Signed)
Manuel [name] arrived in Pulmonary Rehab today to discuss participation. Manuel [name] wishes to withdraw from Pulmonary Rehab due a possible back surgery. I told Manuel [name] he could request a new referral from the [place] if he wishes to return in the future. Manuel [name] voiced understanding.

## 2023-02-19 NOTE — Progress Notes (Signed)
Discharge Progress Report  Patient Details  Name: Manuel [name] MRN: [id number] Date of Birth: 05/23/1960 Referring Provider:   Doristine Devoid Pulmonary Rehab Walk Test from 12/27/2022 in [facility] for Heart, Vascular, & Lung Health  Referring Provider Manuel [name]  [Manuel [name]]        Number of Visits: 7  Reason for Discharge:  Early Exit:  Personal  Smoking History:  Social History   Tobacco Use  Smoking Status Never  Smokeless Tobacco Never    Diagnosis:  No diagnosis found.  ADL [facility]SD:  Pulmonary Assessment Scores     Row Name 12/27/22 1058         ADL UCSD   ADL Phase Entry     SOB Score total 28       CAT Score   CAT Score 10       mMRC Score   mMRC Score 3              Initial Exercise Prescription:  Initial Exercise Prescription - 12/27/22 1100       Date of Initial Exercise RX and Referring Provider   Date 12/27/22    Referring Provider Manuel [name]   Expected Discharge Date 03/25/23      Treadmill   MPH 2.2    Grade 0    Minutes 15      NuStep   Level 2    SPM 60    Minutes 15    METs 2      Prescription Details   Frequency (times per week) 2    Duration Progress to 30 minutes of continuous aerobic without signs/symptoms of physical distress      Intensity   THRR 40-80% of Max Heartrate 64-128    Ratings of Perceived Exertion 11-13    Perceived Dyspnea 0-4      Progression   Progression Continue to progress workloads to maintain intensity without signs/symptoms of physical distress.      Resistance Training   Training Prescription Yes    Weight blue bands    Reps 10-15             Discharge Exercise Prescription (Final Exercise Prescription Changes):  Exercise Prescription Changes - 02/04/23 1425       Response to Exercise   Blood Pressure (Admit) 122/80    Blood Pressure (Exit) 124/88    Heart Rate (Admit) 96 bpm    Heart Rate (Exercise) 114 bpm    Heart Rate (Exit) 106 bpm     Oxygen Saturation (Admit) 97 %    Oxygen Saturation (Exercise) 94 %    Oxygen Saturation (Exit) 96 %    Rating of Perceived Exertion (Exercise) 11    Perceived Dyspnea (Exercise) 2    Duration Continue with 30 min of aerobic exercise without signs/symptoms of physical distress.    Intensity THRR unchanged      Progression   Progression Continue to progress workloads to maintain intensity without signs/symptoms of physical distress.      Resistance Training   Training Prescription Yes    Weight blue bands    Reps 10-15    Time 10 Minutes      Treadmill   MPH 2.5    Grade 0    Minutes 15    METs 2.7      NuStep   Level 3    SPM 111    Minutes 15    METs 3.2  Functional Capacity:  6 Minute Walk     Row Name 12/27/22 1136         6 Minute Walk   Phase Initial     Distance 1260 feet     Walk Time 6 minutes     # of Rest Breaks 0     MPH 2.39     METS 3.12     RPE 12     Perceived Dyspnea  1     VO2 Peak 10.94     Symptoms No     Resting HR 75 bpm     Resting BP 134/80     Resting Oxygen Saturation  96 %     Exercise Oxygen Saturation  during 6 min walk 92 %     Max Ex. HR 107 bpm     Max Ex. BP 142/70     2 Minute Post BP 132/70       Interval HR   1 Minute HR 94     2 Minute HR 100     3 Minute HR 100     4 Minute HR 107     5 Minute HR 103     6 Minute HR 104     2 Minute Post HR 80     Interval Heart Rate? Yes       Interval Oxygen   Interval Oxygen? Yes     Baseline Oxygen Saturation % 96 %     1 Minute Oxygen Saturation % 96 %     1 Minute Liters of Oxygen 0 L     2 Minute Oxygen Saturation % 92 %     2 Minute Liters of Oxygen 0 L     3 Minute Oxygen Saturation % 94 %     3 Minute Liters of Oxygen 0 L     4 Minute Oxygen Saturation % 94 %     4 Minute Liters of Oxygen 0 L     5 Minute Oxygen Saturation % 94 %     5 Minute Liters of Oxygen 0 L     6 Minute Oxygen Saturation % 94 %     6 Minute Liters of Oxygen 0 L      2 Minute Post Oxygen Saturation % 97 %     2 Minute Post Liters of Oxygen 0 L              Psychological, QOL, Others - Outcomes: PHQ 2/9:    12/27/2022   10:57 AM  Depression screen PHQ 2/9  Decreased Interest 0  Down, Depressed, Hopeless 0  PHQ - 2 Score 0  Altered sleeping 0  Tired, decreased energy 0  Change in appetite 0  Feeling bad or failure about yourself  0  Trouble concentrating 0  Moving slowly or fidgety/restless 0  Suicidal thoughts 0  PHQ-9 Score 0    Quality of Life:   Personal Goals: Goals established at orientation with interventions provided to work toward goal.  Personal Goals and Risk Factors at Admission - 12/27/22 1101       Core Components/Risk Factors/Patient Goals on Admission   Improve shortness of breath with ADL's Yes    Intervention Provide education, individualized exercise plan and daily activity instruction to help decrease symptoms of SOB with activities of daily living.    Expected Outcomes Short Term: Improve cardiorespiratory fitness to achieve a reduction of symptoms when performing ADLs;Long Term: Be able to perform more ADLs  without symptoms or delay the onset of symptoms              Personal Goals Discharge:  Goals and Risk Factor Review     Row Name 01/06/23 1208 02/07/23 1507 02/19/23 0952         Core Components/Risk Factors/Patient Goals Review   Personal Goals Review Improve shortness of breath with ADL's;Develop more efficient breathing techniques such as purse lipped breathing and diaphragmatic breathing and practicing self-pacing with activity. Improve shortness of breath with ADL's;Develop more efficient breathing techniques such as purse lipped breathing and diaphragmatic breathing and practicing self-pacing with activity. Improve shortness of breath with ADL's;Develop more efficient breathing techniques such as purse lipped breathing and diaphragmatic breathing and practicing self-pacing with activity.      Review Unable to assess goals. Manuel [name] has not started the program yet due to an acute illness. Core components/risk factors/patient goals monthly re-evaluation are as follows: Goal in progress on improving SOB with ADLs and on developing more efficient breathing techniques such as purse lipped breathing and diaphragmatic breathing; and practicing self-pacing with activity. Manuel [name] has attended 7 classes so far and has maintained his oxygen saturation on room air. He is exercising on the treadmill and the NuStep. He has been able to increase his workload and METs on the NuStep. Manuel [name] is motivated to make progress. He will continue to benefit from participation in PR for nutrition, education, exercise, and lifestyle modification. Manuel [name] was discharged from the PR program on 02/18/23. He states he has been having terrible back pain and may have to undergo surgery. Unfortunately Manuel [name] did not meet his core component goals. Staff told Manuel [name] he could get another referral at a later date once his back has healed.     Expected Outcomes For Manuel [name] to improve his shortness of breath with ADLs and develop more efficient breathing techniques such as purse lipped breathing and diaphragmatic breathing and practicing self-pacing with activity. For Manuel [name] to improve his shortness of breath with ADLs and develop more efficient breathing techniques such as purse lipped breathing and diaphragmatic breathing and practicing self-pacing with activity. --              Exercise Goals and Review:  Exercise Goals     Row Name 12/27/22 1054             Exercise Goals   Increase Physical Activity Yes       Intervention Provide advice, education, support and counseling about physical activity/exercise needs.;Develop an individualized exercise prescription for aerobic and resistive training based on initial evaluation findings, risk stratification, comorbidities and participant's personal goals.       Expected Outcomes Short Term: Attend rehab on  a regular basis to increase amount of physical activity.;Long Term: Add in home exercise to make exercise part of routine and to increase amount of physical activity.;Long Term: Exercising regularly at least 3-5 days a week.       Increase Strength and Stamina Yes       Intervention Provide advice, education, support and counseling about physical activity/exercise needs.;Develop an individualized exercise prescription for aerobic and resistive training based on initial evaluation findings, risk stratification, comorbidities and participant's personal goals.       Expected Outcomes Short Term: Increase workloads from initial exercise prescription for resistance, speed, and METs.;Short Term: Perform resistance training exercises routinely during rehab and add in resistance training at home;Long Term: Improve cardiorespiratory fitness, muscular endurance and strength as measured by increased METs and functional capacity (  )       Able to understand and use rate of perceived exertion (RPE) scale Yes       Intervention Provide education and explanation on how to use RPE scale       Expected Outcomes Short Term: Able to use RPE daily in rehab to express subjective intensity level;Long Term:  Able to use RPE to guide intensity level when exercising independently       Able to understand and use Dyspnea scale Yes       Intervention Provide education and explanation on how to use Dyspnea scale       Expected Outcomes Short Term: Able to use Dyspnea scale daily in rehab to express subjective sense of shortness of breath during exertion;Long Term: Able to use Dyspnea scale to guide intensity level when exercising independently       Knowledge and understanding of Target Heart Rate Range (THRR) Yes       Intervention Provide education and explanation of THRR including how the numbers were predicted and where they are located for reference       Expected Outcomes Short Term: Able to state/look up THRR;Short Term:  Able to use daily as guideline for intensity in rehab;Long Term: Able to use THRR to govern intensity when exercising independently       Understanding of Exercise Prescription Yes       Intervention Provide education, explanation, and written materials on patient's individual exercise prescription       Expected Outcomes Short Term: Able to explain program exercise prescription;Long Term: Able to explain home exercise prescription to exercise independently                Exercise Goals Re-Evaluation:  Exercise Goals Re-Evaluation     Row Name 01/02/23 0830 01/31/23 1156 02/19/23 0956         Exercise Goal Re-Evaluation   Exercise Goals Review Increase Physical Activity;Able to understand and use Dyspnea scale;Understanding of Exercise Prescription;Increase Strength and Stamina;Knowledge and understanding of Target Heart Rate Range (THRR);Able to understand and use rate of perceived exertion (RPE) scale Increase Physical Activity;Able to understand and use Dyspnea scale;Understanding of Exercise Prescription;Increase Strength and Stamina;Knowledge and understanding of Target Heart Rate Range (THRR);Able to understand and use rate of perceived exertion (RPE) scale Increase Physical Activity;Able to understand and use Dyspnea scale;Understanding of Exercise Prescription;Increase Strength and Stamina;Knowledge and understanding of Target Heart Rate Range (THRR);Able to understand and use rate of perceived exertion (RPE) scale     Comments Pt scheduled to begin exercise 12/12. Will monitor and progress as tolerated. Pt has completed 6 days of group exercise. He is walking on the treadmill for 15 min, now speed 2.5, incline 0, METs 2.8. He then is exercising on the recumbent stepper for 15 min, level 3, METS 3.1. Pt is progressing appropriately. He is performing warm up and cool down independently with modifications for his cervical disease. Uses blue bands.  Performs squats. Will monitor and progress  as tolerated. Manuel [name] has completed 7 exercise sessions. His peak METs were 2.7 on the treadmill and 3.2 on the Nustep. Pt wished to be discharged due to future surgery.     Expected Outcomes Through exercise at rehab and home, the patient will decrease shortness of breath with daily activities and feel confident in carrying out an exercise regimen at home Through exercise at rehab and home, the patient will decrease shortness of breath with daily activities and feel confident in carrying out an exercise  regimen at home Through exercise at rehab and home, the patient will decrease shortness of breath with daily activities and feel confident in carrying out an exercise regimen at home              Nutrition & Weight - Outcomes:    Nutrition:   Nutrition Discharge:   Education Questionnaire Score:  Knowledge Questionnaire Score - 12/27/22 1130       Knowledge Questionnaire Score   Pre Score 16/18             Goals reviewed with patient; copy given to patient.

## 2023-02-20 ENCOUNTER — Encounter (HOSPITAL_COMMUNITY): Payer: No Typology Code available for payment source

## 2023-02-25 ENCOUNTER — Encounter (HOSPITAL_COMMUNITY): Payer: No Typology Code available for payment source

## 2023-02-27 ENCOUNTER — Encounter (HOSPITAL_COMMUNITY): Payer: No Typology Code available for payment source

## 2023-03-04 ENCOUNTER — Encounter (HOSPITAL_COMMUNITY): Payer: No Typology Code available for payment source

## 2023-03-06 ENCOUNTER — Encounter (HOSPITAL_COMMUNITY): Payer: No Typology Code available for payment source

## 2023-03-11 ENCOUNTER — Ambulatory Visit: Payer: No Typology Code available for payment source | Attending: Neurological Surgery | Admitting: Physical Therapy

## 2023-03-11 ENCOUNTER — Encounter: Payer: Self-pay | Admitting: Physical Therapy

## 2023-03-11 ENCOUNTER — Encounter (HOSPITAL_COMMUNITY): Payer: No Typology Code available for payment source

## 2023-03-11 ENCOUNTER — Other Ambulatory Visit: Payer: Self-pay

## 2023-03-11 DIAGNOSIS — M542 Cervicalgia: Secondary | ICD-10-CM | POA: Diagnosis present

## 2023-03-11 DIAGNOSIS — R2681 Unsteadiness on feet: Secondary | ICD-10-CM | POA: Diagnosis present

## 2023-03-11 DIAGNOSIS — M5459 Other low back pain: Secondary | ICD-10-CM | POA: Diagnosis present

## 2023-03-11 DIAGNOSIS — M6281 Muscle weakness (generalized): Secondary | ICD-10-CM | POA: Diagnosis present

## 2023-03-11 NOTE — Therapy (Signed)
 OUTPATIENT PHYSICAL THERAPY SHOULDER EVALUATION   Patient Name: Manuel [name] MRN: [id number] DOB:30-Mar-1960, 63 y.o., male Today's Date: 03/11/2023   PT End of Session - 03/11/23 1237     Visit Number 1    Number of Visits 15   1-2x/week   Date for PT Re-Evaluation 05/06/23    Authorization Type [facility]    PT Start Time 1045    PT Stop Time 1122    PT Time Calculation (min) 37 min             Past Medical History:  Diagnosis Date   Arthritis    Asthma    CHF (congestive heart failure) (HCC)    Diabetes mellitus without complication (HCC)    Dyspnea    GERD (gastroesophageal reflux disease)    HLD (hyperlipidemia)    Hypertension    Pneumonia    1981   Seasonal allergies    Past Surgical History:  Procedure Laterality Date   CARDIOVASCULAR STRESS TEST  07/31/2020   Nuclear stress test 07/31/20 [facility]): Negative study for ischemia or scar at 96% maximal heart rate predicted by age.  Overall gated LV systolic function was normal, calculated EF 64% at stress and at rest.  No LV dilatation.   COLONOSCOPY     left arm surgery     POSTERIOR CERVICAL FUSION/FORAMINOTOMY N/A 07/16/2021   Procedure: Posterior cervical fusion with lateral mass fixation - Cervical Three-Cervical Seven,, cervical laminectomy  - Cervical Three-Cevical Six;  Surgeon: Manuel Alert, MD;  Location: [facility] OR;  Service: Neurosurgery;  Laterality: N/A;   Patient Active Problem List   Diagnosis Date Noted   Wound infection after surgery 07/22/2021   S/P cervical spinal fusion 07/16/2021    PCP: Manuel Gambler, MD  REFERRING PROVIDER: Arman Bogus, MD  THERAPY DIAG:  Cervicalgia  Muscle weakness  Unsteadiness on feet  REFERRING DIAG: Cervicalgia [M54.2]   Rationale for Evaluation and Treatment:  Rehabilitation  SUBJECTIVE:  PERTINENT PAST HISTORY:  C3 - C7 fusion and decompression (June 2023), CHF, DM      PRECAUTIONS: None  WEIGHT BEARING RESTRICTIONS No  FALLS:   Has patient fallen in last 6 months? No, Number of falls: 0  MOI/History of condition:  Onset date: Chronic with acute exacerbation over the last few months  SUBJECTIVE STATEMENT  Manuel [name] is a 63 y.o. male who presents to clinic with chief complaint of lower Cx spine, upper Tx spine pain.  He has had chronic neck pain for years resulting in decompression and fusion of C3-C7 in June of 2023.  He does endorse some N/t in the little finger of his L hand which is intermittent.  He was doing pulmonary rehab but d/c d/t back pain and it lowering his blood sugar.  He has concurrent chronic low back pain.    Red flags:  denies   Pain:  Are you having pain? Yes Pain location: lower Cx/upper Tx spine NPRS scale:  5/10 to 9/10 Aggravating factors: increased activity or sudden movements Relieving factors: rest, medication  Pain description: sharp and aching Stage: Chronic  Occupation: NA  Assistive Device: NA  Hand Dominance: ambidextrous   Patient Goals/Specific Activities: reduce pain   OBJECTIVE:   DIAGNOSTIC FINDINGS:   IMPRESSION: 1. Status post posterior fusion and decompression C3-C7, with no residual spinal canal stenosis at these levels. Moderate to severe bilateral neural foraminal narrowing at C6-C7, moderate bilateral neural foraminal narrowing at C4-C5 and C5-C6, and mild to moderate  bilateral neural foraminal narrowing C3-C4, similar to prior. 2. C7-T1 severe right and moderate to severe left neural foraminal narrowing, which has likely progressed on the left. 3. C2-C3 mild bilateral neural foraminal narrowing, unchanged.  GENERAL OBSERVATION: Significant rounded shoulders with forward head and slumped posture     SENSATION: Light touch: Deficits L C8 dermatome   PALPATION: Significant TTP bil UT, bil sub occipitals, bil LS, bil cervical paraspinals  Cervical ROM  ROM ROM  (Eval)  Flexion 10  Extension 4  Right lateral flexion 8  Left  lateral flexion 10  Right rotation 10  Left rotation 12  Flexion rotation (normal is 30 degrees)   Flexion rotation (normal is 30 degrees)     (Blank rows = not tested, N = WNL, * = concordant pain)  UPPER EXTREMITY MMT:  MMT Right (Eval) Left (Eval)  Shoulder flexion 4 3+  Shoulder abduction (C5)    Shoulder ER 4 4  Shoulder IR 4 4  Middle trapezius    Lower trapezius    Shoulder extension    Grip strength    Shoulder shrug (C4)    Elbow flexion (C6)    Elbow ext (C7) c c  Thumb ext (C8) c c  Finger abd (T1) c c  Grossly     (Blank rows = not tested, score listed is out of 5 possible points.  N = WNL, D = diminished, C = clear for gross weakness with myotome testing, * = concordant pain with testing)   PATIENT SURVEYS:  Neck Disability Index: 26 / 50 = 52.0 %    TODAY'S TREATMENT:  Therapeutic Exercise: Creating, reviewing, and completing below HEP    PATIENT EDUCATION (Manuel [name]):  POC, diagnosis, prognosis, HEP, and outcome measures.  Pt educated via explanation, demonstration, and handout (HEP).  Pt confirms understanding verbally.    HOME EXERCISE PROGRAM: Access Code: [email or web address] URL: [email or web address] Date: 03/11/2023 Prepared by: Manuel [name]  Exercises - Seated Scapular Retraction  - 2 x daily - 7 x weekly - 2 sets - 10 reps - 5 sec hold - Seated Thoracic Lumbar Extension  - 2 x daily - 7 x weekly - 3 sets - 10 reps  Treatment priorities   Eval        Thoracic mobility        Manual/TDN        Gentle periscapular/cervical strengthening        Aquatic?                  ASSESSMENT:  CLINICAL IMPRESSION: [name] is a 63 y.o. male who presents to clinic with signs and sxs consistent with neck and upper thoracic pain.  Pt with long standing history of neck pain with decompression and fusion C3 -> C7.  Recent MRI shows no issue s with past fusion but some neural foraminal stenosis in the lower cervical spine.  He does have some  sensory disturbance @ L C8 dermatome which is consistent with imaging.  Overall he shows increased muscle tone and bulk throughout Cx spine, UT, and LS.  He has concurrent low back pain which was not evaluated today d/t time constraints.  [name] will benefit from skilled therapy to address relevant deficits and improve comfort with completion of daily tasks.    OBJECTIVE IMPAIRMENTS: Pain, cervical ROM, UE strength  ACTIVITY LIMITATIONS: sitting, reading, driving  PERSONAL FACTORS: See medical history and pertinent history   REHAB POTENTIAL: Fair chronic pain; see past medical history  CLINICAL  DECISION MAKING: Evolving/moderate complexity  EVALUATION COMPLEXITY: Moderate   GOALS:   SHORT TERM GOALS: Target date: 04/08/2023   Egbert will be >75% HEP compliant to improve carryover between sessions and facilitate independent management of condition  Evaluation: ongoing Goal status: INITIAL   LONG TERM GOALS: Target date: 05/06/2023   Caiden will self report >/= 50% decrease in pain from evaluation to improve function in daily tasks  Evaluation/Baseline: 9/10 max pain Goal status: INITIAL   2.  Jesus will show a >/= 20 pct improvement in their NDI score (MCID ~20% or 10/50 pts) as a proxy for functional improvement  Evaluation/Baseline: Neck Disability Index: 26 / 50 = 52.0 % Goal status: INITIAL   3.  Eoghan will report confidence in self management of condition at time of discharge with advanced HEP  Evaluation/Baseline: unable to self manage Goal status: INITIAL    PLAN: PT FREQUENCY: 1-2x/week  PT DURATION: 8 weeks  PLANNED INTERVENTIONS:  97164- PT Re-evaluation, 97110-Therapeutic exercises, 97530- Therapeutic activity, 97112- Neuromuscular re-education, 97535- Self Care, 16109- Manual therapy, [id number]- Gait training, [id number]- Aquatic Therapy, [phone]- Electrical stimulation (manual), [id number]- Vasopneumatic device, [id number]- Traction (mechanical), [id number]-  Ionotophoresis 4mg /ml Dexamethasone, Taping, Dry Needling, Joint manipulation, and Spinal manipulation.   Manuel [name] PT, DPT 03/11/2023, 12:39 PM

## 2023-03-13 ENCOUNTER — Encounter (HOSPITAL_COMMUNITY): Payer: No Typology Code available for payment source

## 2023-03-18 ENCOUNTER — Encounter (HOSPITAL_COMMUNITY): Payer: No Typology Code available for payment source

## 2023-03-18 ENCOUNTER — Ambulatory Visit: Payer: No Typology Code available for payment source

## 2023-03-18 DIAGNOSIS — M6281 Muscle weakness (generalized): Secondary | ICD-10-CM

## 2023-03-18 DIAGNOSIS — M542 Cervicalgia: Secondary | ICD-10-CM

## 2023-03-18 DIAGNOSIS — M5459 Other low back pain: Secondary | ICD-10-CM

## 2023-03-18 NOTE — Therapy (Signed)
 OUTPATIENT PHYSICAL THERAPY NOTE   Patient Name: Manuel [name] MRN: [id number] DOB:05-03-1960, 63 y.o., male Today's Date: 03/18/2023   PT End of Session - 03/18/23 1622     Visit Number 2    Number of Visits 15    Date for PT Re-Evaluation 05/06/23    Authorization Type [facility]    PT Start Time 1619    PT Stop Time 1658    PT Time Calculation (min) 39 min    Activity Tolerance Patient tolerated treatment well    Behavior During Therapy WFL for tasks assessed/performed              Past Medical History:  Diagnosis Date   Arthritis    Asthma    CHF (congestive heart failure) (HCC)    Diabetes mellitus without complication (HCC)    Dyspnea    GERD (gastroesophageal reflux disease)    HLD (hyperlipidemia)    Hypertension    Pneumonia    1981   Seasonal allergies    Past Surgical History:  Procedure Laterality Date   CARDIOVASCULAR STRESS TEST  07/31/2020   Nuclear stress test 07/31/20 [facility]): Negative study for ischemia or scar at 96% maximal heart rate predicted by age.  Overall gated LV systolic function was normal, calculated EF 64% at stress and at rest.  No LV dilatation.   COLONOSCOPY     left arm surgery     POSTERIOR CERVICAL FUSION/FORAMINOTOMY N/A 07/16/2021   Procedure: Posterior cervical fusion with lateral mass fixation - Cervical Three-Cervical Seven,, cervical laminectomy  - Cervical Three-Cevical Six;  Surgeon: Tia Alert, MD;  Location: [facility] OR;  Service: Neurosurgery;  Laterality: N/A;   Patient Active Problem List   Diagnosis Date Noted   Wound infection after surgery 07/22/2021   S/P cervical spinal fusion 07/16/2021    PCP: Sherwood Gambler, MD  REFERRING PROVIDER: Arman Bogus, MD  THERAPY DIAG:  Cervicalgia  Other low back pain  Muscle weakness  REFERRING DIAG: Cervicalgia [M54.2]   Rationale for Evaluation and Treatment:  Rehabilitation  SUBJECTIVE:  PERTINENT PAST HISTORY:  C3 - C7 fusion and decompression  (June 2023), CHF, DM      PRECAUTIONS: None  WEIGHT BEARING RESTRICTIONS No  FALLS:  Has patient fallen in last 6 months? No, Number of falls: 0  MOI/History of condition:  Onset date: Chronic with acute exacerbation over the last few months  SUBJECTIVE STATEMENT  03/18/2023: Patient reports to without any significant pain. He states that he is keeping up with HEP and walking regularly. "I have to watch how much exercises I do, because my glucose drops."   Manuel [name] is a 63 y.o. male who presents to clinic with chief complaint of lower Cx spine, upper Tx spine pain.  He has had chronic neck pain for years resulting in decompression and fusion of C3-C7 in June of 2023.  He does endorse some N/t in the little finger of his L hand which is intermittent.  He was doing pulmonary rehab but d/c d/t back pain and it lowering his blood sugar.  He has concurrent chronic low back pain.    Red flags:  denies   Pain:  Are you having pain? Yes Pain location: lower Cx/upper Tx spine NPRS scale:  5/10 to 9/10 Aggravating factors: increased activity or sudden movements Relieving factors: rest, medication  Pain description: sharp and aching Stage: Chronic  Occupation: NA  Assistive Device: NA  Hand Dominance: ambidextrous   Patient  Goals/Specific Activities: reduce pain   OBJECTIVE:   DIAGNOSTIC FINDINGS:   IMPRESSION: 1. Status post posterior fusion and decompression C3-C7, with no residual spinal canal stenosis at these levels. Moderate to severe bilateral neural foraminal narrowing at C6-C7, moderate bilateral neural foraminal narrowing at C4-C5 and C5-C6, and mild to moderate bilateral neural foraminal narrowing C3-C4, similar to prior. 2. C7-T1 severe right and moderate to severe left neural foraminal narrowing, which has likely progressed on the left. 3. C2-C3 mild bilateral neural foraminal narrowing, unchanged.  GENERAL OBSERVATION: Significant rounded  shoulders with forward head and slumped posture     SENSATION: Light touch: Deficits L C8 dermatome   PALPATION: Significant TTP bil UT, bil sub occipitals, bil LS, bil cervical paraspinals  Cervical ROM  ROM ROM  (Eval)  Flexion 10  Extension 4  Right lateral flexion 8  Left lateral flexion 10  Right rotation 10  Left rotation 12  Flexion rotation (normal is 30 degrees)   Flexion rotation (normal is 30 degrees)     (Blank rows = not tested, N = WNL, * = concordant pain)  UPPER EXTREMITY MMT:  MMT Right (Eval) Left (Eval)  Shoulder flexion 4 3+  Shoulder abduction (C5)    Shoulder ER 4 4  Shoulder IR 4 4  Middle trapezius    Lower trapezius    Shoulder extension    Grip strength    Shoulder shrug (C4)    Elbow flexion (C6)    Elbow ext (C7) c c  Thumb ext (C8) c c  Finger abd (T1) c c  Grossly     (Blank rows = not tested, score listed is out of 5 possible points.  N = WNL, D = diminished, C = clear for gross weakness with myotome testing, * = concordant pain with testing)   PATIENT SURVEYS:  Neck Disability Index: 26 / 50 = 52.0 %    TODAY'S TREATMENT:   [facility] Adult PT Treatment:                                                DATE: 03/18/2023  Therapeutic Exercise: UBE 3 min ea fwd/back, level 1  Pball up wall x 10 each direction  Standing rows, 2 x 10 green band  Standing pull downs, 2 x 10 blue band  Thoracic rotation open books, 2 x 10  Supine horizontal abduction, 2 x 10 red band  Updated and reviewed HEP      PATIENT EDUCATION (/HM):  POC, diagnosis, prognosis, HEP, and outcome measures.  Pt educated via explanation, demonstration, and handout (HEP).  Pt confirms understanding verbally.    HOME EXERCISE PROGRAM: Access Code: [email or web address] URL: [email or web address] Date: 03/18/2023 Prepared by: Mauri Reading  Exercises - Seated Scapular Retraction  - 2 x daily - 7 x weekly - 2 sets - 10 reps - 5 sec hold - Seated Thoracic  Lumbar Extension  - 2 x daily - 7 x weekly - 3 sets - 10 reps - Supine Shoulder Horizontal Abduction with Resistance  - 1 x daily - 7 x weekly - 2 sets - 10 reps - Bilateral Shoulder Flexion Wall Slide with Towel  - 1 x daily - 7 x weekly - 2 sets - 10 reps - 3 sec hold  Treatment priorities   Eval        Thoracic mobility  Manual/TDN        Gentle periscapular/cervical strengthening        Aquatic?                  ASSESSMENT:  CLINICAL IMPRESSION:  2/25/2025Kenneth responded well to initial treatment session. He had most difficulty with s/L open books, requiring additional cueing with consideration for LBP. He denies any worsening of symptoms at end of session. We will continue to progress per established POC and utilization of TPDN as indicated.    Manuel [name] is a 63 y.o. male who presents to clinic with signs and sxs consistent with neck and upper thoracic pain.  Pt with long standing history of neck pain with decompression and fusion C3 -> C7.  Recent MRI shows no issue s with past fusion but some neural foraminal stenosis in the lower cervical spine.  He does have some sensory disturbance @ L C8 dermatome which is consistent with imaging.  Overall he shows increased muscle tone and bulk throughout Cx spine, UT, and LS.  He has concurrent low back pain which was not evaluated today d/t time constraints.  Manuel [name] will benefit from skilled therapy to address relevant deficits and improve comfort with completion of daily tasks.    OBJECTIVE IMPAIRMENTS: Pain, cervical ROM, UE strength  ACTIVITY LIMITATIONS: sitting, reading, driving  PERSONAL FACTORS: See medical history and pertinent history   REHAB POTENTIAL: Fair chronic pain; see past medical history  CLINICAL DECISION MAKING: Evolving/moderate complexity  EVALUATION COMPLEXITY: Moderate   GOALS:   SHORT TERM GOALS: Target date: 04/08/2023   Manuel [name] will be >75% HEP compliant to improve carryover between sessions  and facilitate independent management of condition  Evaluation: ongoing Goal status: INITIAL   LONG TERM GOALS: Target date: 05/06/2023   Manuel [name] will self report >/= 50% decrease in pain from evaluation to improve function in daily tasks  Evaluation/Baseline: 9/10 max pain Goal status: INITIAL   2.  Manuel [name] will show a >/= 20 pct improvement in their NDI score (MCID ~20% or 10/50 pts) as a proxy for functional improvement  Evaluation/Baseline: Neck Disability Index: 26 / 50 = 52.0 % Goal status: INITIAL   3.  Manuel [name] will report confidence in self management of condition at time of discharge with advanced HEP  Evaluation/Baseline: unable to self manage Goal status: INITIAL    PLAN: PT FREQUENCY: 1-2x/week  PT DURATION: 8 weeks  PLANNED INTERVENTIONS:  97164- PT Re-evaluation, 97110-Therapeutic exercises, 97530- Therapeutic activity, 97112- Neuromuscular re-education, 97535- Self Care, 16109- Manual therapy, [id number]- Gait training, [id number]- Aquatic Therapy, [phone]- Electrical stimulation (manual), [id number]- Vasopneumatic device, [id number]- Traction (mechanical), [id number]- Ionotophoresis 4mg /ml Dexamethasone, Taping, Dry Needling, Joint manipulation, and Spinal manipulation.   Mauri Reading, PT, DPT  03/18/2023 7:17 PM

## 2023-03-19 ENCOUNTER — Ambulatory Visit: Payer: No Typology Code available for payment source | Admitting: Physical Therapy

## 2023-03-19 ENCOUNTER — Encounter: Payer: Self-pay | Admitting: Physical Therapy

## 2023-03-19 DIAGNOSIS — M6281 Muscle weakness (generalized): Secondary | ICD-10-CM

## 2023-03-19 DIAGNOSIS — M542 Cervicalgia: Secondary | ICD-10-CM | POA: Diagnosis not present

## 2023-03-19 DIAGNOSIS — R2681 Unsteadiness on feet: Secondary | ICD-10-CM

## 2023-03-19 DIAGNOSIS — M5459 Other low back pain: Secondary | ICD-10-CM

## 2023-03-19 NOTE — Therapy (Signed)
 OUTPATIENT PHYSICAL THERAPY NOTE   Patient Name: Manuel [name] MRN: [id number] DOB:1960-06-02, 63 y.o., male Today's Date: 03/19/2023   PT End of Session - 03/19/23 1527     Visit Number 3    Number of Visits 15    Date for PT Re-Evaluation 05/06/23    Authorization Type [facility]    PT Start Time 1530    PT Stop Time 1612    PT Time Calculation (min) 42 min    Activity Tolerance Patient tolerated treatment well    Behavior During Therapy WFL for tasks assessed/performed              Past Medical History:  Diagnosis Date   Arthritis    Asthma    CHF (congestive heart failure) (HCC)    Diabetes mellitus without complication (HCC)    Dyspnea    GERD (gastroesophageal reflux disease)    HLD (hyperlipidemia)    Hypertension    Pneumonia    1981   Seasonal allergies    Past Surgical History:  Procedure Laterality Date   CARDIOVASCULAR STRESS TEST  07/31/2020   Nuclear stress test 07/31/20 [facility]): Negative study for ischemia or scar at 96% maximal heart rate predicted by age.  Overall gated LV systolic function was normal, calculated EF 64% at stress and at rest.  No LV dilatation.   COLONOSCOPY     left arm surgery     POSTERIOR CERVICAL FUSION/FORAMINOTOMY N/A 07/16/2021   Procedure: Posterior cervical fusion with lateral mass fixation - Cervical Three-Cervical Seven,, cervical laminectomy  - Cervical Three-Cevical Six;  Surgeon: Tia Alert, MD;  Location: [facility] OR;  Service: Neurosurgery;  Laterality: N/A;   Patient Active Problem List   Diagnosis Date Noted   Wound infection after surgery 07/22/2021   S/P cervical spinal fusion 07/16/2021    PCP: Sherwood Gambler, MD  REFERRING PROVIDER: Sherwood Gambler, MD  THERAPY DIAG:  Cervicalgia  Other low back pain  Muscle weakness  Unsteadiness on feet  REFERRING DIAG: Cervicalgia [M54.2]   Rationale for Evaluation and Treatment:  Rehabilitation  SUBJECTIVE:  PERTINENT PAST HISTORY:  C3 - C7  fusion and decompression (June 2023), CHF, DM      PRECAUTIONS: None  WEIGHT BEARING RESTRICTIONS No  FALLS:  Has patient fallen in last 6 months? No, Number of falls: 0  MOI/History of condition:  Onset date: Chronic with acute exacerbation over the last few months  SUBJECTIVE STATEMENT  03/19/2023: Patient states that his neck pain is on and off but overall has improved somewhat with HEP.   Manuel [name] is a 63 y.o. male who presents to clinic with chief complaint of lower Cx spine, upper Tx spine pain.  He has had chronic neck pain for years resulting in decompression and fusion of C3-C7 in June of 2023.  He does endorse some N/t in the little finger of his L hand which is intermittent.  He was doing pulmonary rehab but d/c d/t back pain and it lowering his blood sugar.  He has concurrent chronic low back pain.    Red flags:  denies   Pain:  Are you having pain? Yes Pain location: lower Cx/upper Tx spine NPRS scale:  5/10 to 9/10 Aggravating factors: increased activity or sudden movements Relieving factors: rest, medication  Pain description: sharp and aching Stage: Chronic  Occupation: NA  Assistive Device: NA  Hand Dominance: ambidextrous   Patient Goals/Specific Activities: reduce pain   OBJECTIVE:   DIAGNOSTIC FINDINGS:  IMPRESSION: 1. Status post posterior fusion and decompression C3-C7, with no residual spinal canal stenosis at these levels. Moderate to severe bilateral neural foraminal narrowing at C6-C7, moderate bilateral neural foraminal narrowing at C4-C5 and C5-C6, and mild to moderate bilateral neural foraminal narrowing C3-C4, similar to prior. 2. C7-T1 severe right and moderate to severe left neural foraminal narrowing, which has likely progressed on the left. 3. C2-C3 mild bilateral neural foraminal narrowing, unchanged.  GENERAL OBSERVATION: Significant rounded shoulders with forward head and slumped  posture     SENSATION: Light touch: Deficits L C8 dermatome   PALPATION: Significant TTP bil UT, bil sub occipitals, bil LS, bil cervical paraspinals  Cervical ROM  ROM ROM  (Eval)  Flexion 10  Extension 4  Right lateral flexion 8  Left lateral flexion 10  Right rotation 10  Left rotation 12  Flexion rotation (normal is 30 degrees)   Flexion rotation (normal is 30 degrees)     (Blank rows = not tested, N = WNL, * = concordant pain)  UPPER EXTREMITY MMT:  MMT Right (Eval) Left (Eval)  Shoulder flexion 4 3+  Shoulder abduction (C5)    Shoulder ER 4 4  Shoulder IR 4 4  Middle trapezius    Lower trapezius    Shoulder extension    Grip strength    Shoulder shrug (C4)    Elbow flexion (C6)    Elbow ext (C7) c c  Thumb ext (C8) c c  Finger abd (T1) c c  Grossly     (Blank rows = not tested, score listed is out of 5 possible points.  N = WNL, D = diminished, C = clear for gross weakness with myotome testing, * = concordant pain with testing)   PATIENT SURVEYS:  Neck Disability Index: 26 / 50 = 52.0 %    TODAY'S TREATMENT:   [facility] Adult PT Treatment:                                                DATE: 03/19/2023  Therapeutic Exercise: UBE 3 min ea fwd/back, level 1  Pball up wall x 10 each direction  Standing rows, 2 x 10 black band  Standing pull downs, 2 x 10 blue band  Shoulder rolls between sets of standing band exercises Thoracic rotation open books 20x Supine horizontal abduction, 2 x 10 red band  Supine fly - 2# - 2x10  Manual therapy: Skilled palpation to identify trigger points for TDN STM to all listed muscles following TDN  Trigger Point Dry Needling  Initial Treatment: Pt instructed on Dry Needling rational, procedures, and possible side effects. Pt instructed to expect mild to moderate muscle soreness later in the day and/or into the next day.  Pt instructed in methods to reduce muscle soreness. Pt instructed to continue prescribed  HEP. Because Dry Needling was performed over or adjacent to a lung field, pt was educated on S/S of pneumothorax and to seek immediate medical attention should they occur.  Patient was educated on signs and symptoms of infection and other risk factors and advised to seek medical attention should they occur.  Patient verbalized understanding of these instructions and education.   Patient Verbal Consent Given: Yes Education Handout Provided: Yes Muscles Treated: Bil UT, Bil sub occipitals Electrical Stimulation Performed: No Treatment Response/Outcome: twitch     HOME EXERCISE PROGRAM:  Access Code: [email or web address] URL: [email or web address] Date: 03/18/2023 Prepared by: Mauri Reading  Exercises - Seated Scapular Retraction  - 2 x daily - 7 x weekly - 2 sets - 10 reps - 5 sec hold - Seated Thoracic Lumbar Extension  - 2 x daily - 7 x weekly - 3 sets - 10 reps - Supine Shoulder Horizontal Abduction with Resistance  - 1 x daily - 7 x weekly - 2 sets - 10 reps - Bilateral Shoulder Flexion Wall Slide with Towel  - 1 x daily - 7 x weekly - 2 sets - 10 reps - 3 sec hold  Treatment priorities   Eval        Thoracic mobility        Manual/TDN        Gentle periscapular/cervical strengthening        Aquatic?                  ASSESSMENT:  CLINICAL IMPRESSION:  03/19/2023: Iantha Fallen tolerated session well with no adverse reaction.  Chester tolerates progression of basic periscapular strengthening exercises with no increase in pain.  He responded well to TDN with twitch and reduction in muscle tension.  Will gauge overall benefit next visit.   EVAL: Jasiri is a 63 y.o. male who presents to clinic with signs and sxs consistent with neck and upper thoracic pain.  Pt with long standing history of neck pain with decompression and fusion C3 -> C7.  Recent MRI shows no issue s with past fusion but some neural foraminal stenosis in the lower cervical spine.  He does have some sensory  disturbance @ L C8 dermatome which is consistent with imaging.  Overall he shows increased muscle tone and bulk throughout Cx spine, UT, and LS.  He has concurrent low back pain which was not evaluated today d/t time constraints.  Bijan will benefit from skilled therapy to address relevant deficits and improve comfort with completion of daily tasks.    OBJECTIVE IMPAIRMENTS: Pain, cervical ROM, UE strength  ACTIVITY LIMITATIONS: sitting, reading, driving  PERSONAL FACTORS: See medical history and pertinent history   REHAB POTENTIAL: Fair chronic pain; see past medical history  CLINICAL DECISION MAKING: Evolving/moderate complexity  EVALUATION COMPLEXITY: Moderate   GOALS:   SHORT TERM GOALS: Target date: 04/08/2023   Yvon will be >75% HEP compliant to improve carryover between sessions and facilitate independent management of condition  Evaluation: ongoing Goal status: INITIAL   LONG TERM GOALS: Target date: 05/06/2023   Braydin will self report >/= 50% decrease in pain from evaluation to improve function in daily tasks  Evaluation/Baseline: 9/10 max pain Goal status: INITIAL   2.  Bonny will show a >/= 20 pct improvement in their NDI score (MCID ~20% or 10/50 pts) as a proxy for functional improvement  Evaluation/Baseline: Neck Disability Index: 26 / 50 = 52.0 % Goal status: INITIAL   3.  Whitaker will report confidence in self management of condition at time of discharge with advanced HEP  Evaluation/Baseline: unable to self manage Goal status: INITIAL    PLAN: PT FREQUENCY: 1-2x/week  PT DURATION: 8 weeks  PLANNED INTERVENTIONS:  97164- PT Re-evaluation, 97110-Therapeutic exercises, 97530- Therapeutic activity, 97112- Neuromuscular re-education, 97535- Self Care, [id number]- Manual therapy, [id number]- Gait training, [id number]- Aquatic Therapy, [phone]- Electrical stimulation (manual), [id number]- Vasopneumatic device, [id number]- Traction (mechanical), [id number]- Ionotophoresis  4mg /ml Dexamethasone, Taping, Dry Needling, Joint manipulation, and Spinal manipulation.   Kimberlee Nearing Murle Hellstrom PT 03/19/2023 4:42 PM

## 2023-03-19 NOTE — Patient Instructions (Signed)

## 2023-03-20 ENCOUNTER — Encounter (HOSPITAL_COMMUNITY): Payer: No Typology Code available for payment source

## 2023-03-25 ENCOUNTER — Ambulatory Visit: Payer: No Typology Code available for payment source | Attending: Neurological Surgery | Admitting: Physical Therapy

## 2023-03-25 ENCOUNTER — Encounter: Payer: Self-pay | Admitting: Physical Therapy

## 2023-03-25 ENCOUNTER — Encounter (HOSPITAL_COMMUNITY): Payer: No Typology Code available for payment source

## 2023-03-25 DIAGNOSIS — M6281 Muscle weakness (generalized): Secondary | ICD-10-CM | POA: Diagnosis present

## 2023-03-25 DIAGNOSIS — R2681 Unsteadiness on feet: Secondary | ICD-10-CM | POA: Insufficient documentation

## 2023-03-25 DIAGNOSIS — M542 Cervicalgia: Secondary | ICD-10-CM | POA: Diagnosis present

## 2023-03-25 DIAGNOSIS — M5459 Other low back pain: Secondary | ICD-10-CM | POA: Diagnosis present

## 2023-03-25 NOTE — Therapy (Signed)
 OUTPATIENT PHYSICAL THERAPY NOTE   Patient Name: Manuel [name] MRN: [id number] DOB:29-Sep-1960, 63 y.o., male Today's Date: 03/25/2023   PT End of Session - 03/25/23 1214     Visit Number 4    Number of Visits 15    Date for PT Re-Evaluation 05/06/23    Authorization Type [facility]    PT Start Time 1215    PT Stop Time 1256    PT Time Calculation (min) 41 min    Activity Tolerance Patient tolerated treatment well    Behavior During Therapy WFL for tasks assessed/performed              Past Medical History:  Diagnosis Date   Arthritis    Asthma    CHF (congestive heart failure) (HCC)    Diabetes mellitus without complication (HCC)    Dyspnea    GERD (gastroesophageal reflux disease)    HLD (hyperlipidemia)    Hypertension    Pneumonia    1981   Seasonal allergies    Past Surgical History:  Procedure Laterality Date   CARDIOVASCULAR STRESS TEST  07/31/2020   Nuclear stress test 07/31/20 [facility]): Negative study for ischemia or scar at 96% maximal heart rate predicted by age.  Overall gated LV systolic function was normal, calculated EF 64% at stress and at rest.  No LV dilatation.   COLONOSCOPY     left arm surgery     POSTERIOR CERVICAL FUSION/FORAMINOTOMY N/A 07/16/2021   Procedure: Posterior cervical fusion with lateral mass fixation - Cervical Three-Cervical Seven,, cervical laminectomy  - Cervical Three-Cevical Six;  Surgeon: Manuel Alert, MD;  Location: [facility] OR;  Service: Neurosurgery;  Laterality: N/A;   Patient Active Problem List   Diagnosis Date Noted   Wound infection after surgery 07/22/2021   S/P cervical spinal fusion 07/16/2021    PCP: Manuel Gambler, MD  REFERRING PROVIDER: Sherwood Gambler, MD  THERAPY DIAG:  Cervicalgia  Other low back pain  Muscle weakness  Unsteadiness on feet  REFERRING DIAG: Cervicalgia [M54.2]   Rationale for Evaluation and Treatment:  Rehabilitation  SUBJECTIVE:  PERTINENT PAST HISTORY:  C3 - C7  fusion and decompression (June 2023), CHF, DM      PRECAUTIONS: None  WEIGHT BEARING RESTRICTIONS No  FALLS:  Has patient [name] in last 6 months? No, Number of falls: 0  MOI/History of condition:  Onset date: Chronic with acute exacerbation over the last few months  SUBJECTIVE STATEMENT  03/25/2023: Pt states that the TDN was uncomfortable, but helpful.  He just had a steroid injection in his low back which is very uncomfortable and he requests we avoid exercise today.   Manuel [name] is a 63 y.o. male who presents to clinic with chief complaint of lower Cx spine, upper Tx spine pain.  He has had chronic neck pain for years resulting in decompression and fusion of C3-C7 in June of 2023.  He does endorse some N/t in the little finger of his L hand which is intermittent.  He was doing pulmonary rehab but d/c d/t back pain and it lowering his blood sugar.  He has concurrent chronic low back pain.    Red flags:  denies   Pain:  Are you having pain? Yes Pain location: lower Cx/upper Tx spine NPRS scale:  5/10 to 9/10 Aggravating factors: increased activity or sudden movements Relieving factors: rest, medication  Pain description: sharp and aching Stage: Chronic  Occupation: NA  Assistive Device: NA  Hand Dominance: ambidextrous  Patient Goals/Specific Activities: reduce pain   OBJECTIVE:   DIAGNOSTIC FINDINGS:   IMPRESSION: 1. Status post posterior fusion and decompression C3-C7, with no residual spinal canal stenosis at these levels. Moderate to severe bilateral neural foraminal narrowing at C6-C7, moderate bilateral neural foraminal narrowing at C4-C5 and C5-C6, and mild to moderate bilateral neural foraminal narrowing C3-C4, similar to prior. 2. C7-T1 severe right and moderate to severe left neural foraminal narrowing, which has likely progressed on the left. 3. C2-C3 mild bilateral neural foraminal narrowing, unchanged.  GENERAL  OBSERVATION: Significant rounded shoulders with forward head and slumped posture     SENSATION: Light touch: Deficits L C8 dermatome   PALPATION: Significant TTP bil UT, bil sub occipitals, bil LS, bil cervical paraspinals  Cervical ROM  ROM ROM  (Eval)  Flexion 10  Extension 4  Right lateral flexion 8  Left lateral flexion 10  Right rotation 10  Left rotation 12  Flexion rotation (normal is 30 degrees)   Flexion rotation (normal is 30 degrees)     (Blank rows = not tested, N = WNL, * = concordant pain)  UPPER EXTREMITY MMT:  MMT Right (Eval) Left (Eval)  Shoulder flexion 4 3+  Shoulder abduction (C5)    Shoulder ER 4 4  Shoulder IR 4 4  Middle trapezius    Lower trapezius    Shoulder extension    Grip strength    Shoulder shrug (C4)    Elbow flexion (C6)    Elbow ext (C7) c c  Thumb ext (C8) c c  Finger abd (T1) c c  Grossly     (Blank rows = not tested, score listed is out of 5 possible points.  N = WNL, D = diminished, C = clear for gross weakness with myotome testing, * = concordant pain with testing)   PATIENT SURVEYS:  Neck Disability Index: 26 / 50 = 52.0 %    TODAY'S TREATMENT:   [facility] Adult PT Treatment:                                                DATE: 03/25/2023  Therapeutic Exercise: UBE 3 min ea fwd/back, level 1  Education regarding overall benefits of strengthening vs passive treatments; need to take a few days off from exercise following injection  Manual therapy: Skilled palpation to identify trigger points for TDN STM to all listed muscles following TDN Sub occipital release STM bil cervical paraspinals  Trigger Point Dry Needling  Subsequent Treatment: Instructions provided previously at initial dry needling treatment.   Patient Verbal Consent Given: Yes Education Handout Provided: Previously Provided Muscles Treated: bil UT, bil sub occipitals Electrical Stimulation Performed: No Treatment Response/Outcome:  twitch      HOME EXERCISE PROGRAM: Access Code: [email or web address] URL: [email or web address] Date: 03/18/2023 Prepared by: Manuel [name]  Exercises - Seated Scapular Retraction  - 2 x daily - 7 x weekly - 2 sets - 10 reps - 5 sec hold - Seated Thoracic Lumbar Extension  - 2 x daily - 7 x weekly - 3 sets - 10 reps - Supine Shoulder Horizontal Abduction with Resistance  - 1 x daily - 7 x weekly - 2 sets - 10 reps - Bilateral Shoulder Flexion Wall Slide with Towel  - 1 x daily - 7 x weekly - 2 sets - 10 reps - 3 sec hold  Treatment priorities   Eval        Thoracic mobility        Manual/TDN        Gentle periscapular/cervical strengthening        Aquatic?                  ASSESSMENT:  CLINICAL IMPRESSION:  03/25/2023: Manuel [name] tolerated session well with no adverse reaction.  D/t lumbar steroid injection yesterday we avoided most therex today, focusing instead on pt education and pain modulation with TDN and manual.  He reports overall significant improvement following manual therapy.   EVAL: Karnell is a 63 y.o. male who presents to clinic with signs and sxs consistent with neck and upper thoracic pain.  Pt with long standing history of neck pain with decompression and fusion C3 -> C7.  Recent MRI shows no issue s with past fusion but some neural foraminal stenosis in the lower cervical spine.  He does have some sensory disturbance @ L C8 dermatome which is consistent with imaging.  Overall he shows increased muscle tone and bulk throughout Cx spine, UT, and LS.  He has concurrent low back pain which was not evaluated today d/t time constraints.  Javious will benefit from skilled therapy to address relevant deficits and improve comfort with completion of daily tasks.    OBJECTIVE IMPAIRMENTS: Pain, cervical ROM, UE strength  ACTIVITY LIMITATIONS: sitting, [name], driving  PERSONAL FACTORS: See medical history and pertinent history   REHAB POTENTIAL: Fair chronic pain; see  past medical history  CLINICAL DECISION MAKING: Evolving/moderate complexity  EVALUATION COMPLEXITY: Moderate   GOALS:   SHORT TERM GOALS: Target date: 04/08/2023   Treyvon will be >75% HEP compliant to improve carryover between sessions and facilitate independent management of condition  Evaluation: ongoing Goal status: INITIAL   LONG TERM GOALS: Target date: 05/06/2023   Billey will self report >/= 50% decrease in pain from evaluation to improve function in daily tasks  Evaluation/Baseline: 9/10 max pain Goal status: INITIAL   2.  Jordie will show a >/= 20 pct improvement in their NDI score (MCID ~20% or 10/50 pts) as a proxy for functional improvement  Evaluation/Baseline: Neck Disability Index: 26 / 50 = 52.0 % Goal status: INITIAL   3.  Gagandeep will report confidence in self management of condition at time of discharge with advanced HEP  Evaluation/Baseline: unable to self manage Goal status: INITIAL    PLAN: PT FREQUENCY: 1-2x/week  PT DURATION: 8 weeks  PLANNED INTERVENTIONS:  97164- PT Re-evaluation, 97110-Therapeutic exercises, 97530- Therapeutic activity, 97112- Neuromuscular re-education, 97535- Self Care, 16109- Manual therapy, [id number]- Gait training, [id number]- Aquatic Therapy, [phone]- Electrical stimulation (manual), [id number]- Vasopneumatic device, [id number]- Traction (mechanical), [id number]- Ionotophoresis 4mg /ml Dexamethasone, Taping, Dry Needling, Joint manipulation, and Spinal manipulation.   Kimberlee Nearing Epimenio Schetter PT 03/25/2023 1:06 PM

## 2023-03-27 ENCOUNTER — Ambulatory Visit: Payer: No Typology Code available for payment source | Admitting: Physical Therapy

## 2023-03-27 ENCOUNTER — Encounter: Payer: Self-pay | Admitting: Physical Therapy

## 2023-03-27 DIAGNOSIS — M542 Cervicalgia: Secondary | ICD-10-CM

## 2023-03-27 DIAGNOSIS — M5459 Other low back pain: Secondary | ICD-10-CM

## 2023-03-27 DIAGNOSIS — M6281 Muscle weakness (generalized): Secondary | ICD-10-CM

## 2023-03-27 NOTE — Therapy (Signed)
 OUTPATIENT PHYSICAL THERAPY NOTE   Patient Name: Manuel [name] MRN: [id number] DOB:07-25-1960, 64 y.o., male Today's Date: 03/27/2023   PT End of Session - 03/27/23 1211     Visit Number 5    Number of Visits 15    Date for PT Re-Evaluation 05/06/23    Authorization Type [facility]    PT Start Time 1215    PT Stop Time 1256    PT Time Calculation (min) 41 min    Activity Tolerance Patient tolerated treatment well    Behavior During Therapy WFL for tasks assessed/performed              Past Medical History:  Diagnosis Date   Arthritis    Asthma    CHF (congestive heart failure) (HCC)    Diabetes mellitus without complication (HCC)    Dyspnea    GERD (gastroesophageal reflux disease)    HLD (hyperlipidemia)    Hypertension    Pneumonia    1981   Seasonal allergies    Past Surgical History:  Procedure Laterality Date   CARDIOVASCULAR STRESS TEST  07/31/2020   Nuclear stress test 07/31/20 [facility]): Negative study for ischemia or scar at 96% maximal heart rate predicted by age.  Overall gated LV systolic function was normal, calculated EF 64% at stress and at rest.  No LV dilatation.   COLONOSCOPY     left arm surgery     POSTERIOR CERVICAL FUSION/FORAMINOTOMY N/A 07/16/2021   Procedure: Posterior cervical fusion with lateral mass fixation - Cervical Three-Cervical Seven,, cervical laminectomy  - Cervical Three-Cevical Six;  Surgeon: Tia Alert, MD;  Location: [facility] OR;  Service: Neurosurgery;  Laterality: N/A;   Patient Active Problem List   Diagnosis Date Noted   Wound infection after surgery 07/22/2021   S/P cervical spinal fusion 07/16/2021    PCP: Sherwood Gambler, MD  REFERRING PROVIDER: Arman Bogus, MD  THERAPY DIAG:  Cervicalgia  Other low back pain  Muscle weakness  REFERRING DIAG: Cervicalgia [M54.2]   Rationale for Evaluation and Treatment:  Rehabilitation  SUBJECTIVE:  PERTINENT PAST HISTORY:  C3 - C7 fusion and decompression  (June 2023), CHF, DM      PRECAUTIONS: None  WEIGHT BEARING RESTRICTIONS No  FALLS:  Has patient [name] in last 6 months? No, Number of falls: 0  MOI/History of condition:  Onset date: Chronic with acute exacerbation over the last few months  SUBJECTIVE STATEMENT  03/27/2023: Pt reports that he feels better following TDN.  He feels it helps relax the bottom part of his neck.   Manuel [name] is a 63 y.o. male who presents to clinic with chief complaint of lower Cx spine, upper Tx spine pain.  He has had chronic neck pain for years resulting in decompression and fusion of C3-C7 in June of 2023.  He does endorse some N/t in the little finger of his L hand which is intermittent.  He was doing pulmonary rehab but d/c d/t back pain and it lowering his blood sugar.  He has concurrent chronic low back pain.    Red flags:  denies   Pain:  Are you having pain? Yes Pain location: lower Cx/upper Tx spine NPRS scale:  5/10 to 9/10 Aggravating factors: increased activity or sudden movements Relieving factors: rest, medication  Pain description: sharp and aching Stage: Chronic  Occupation: NA  Assistive Device: NA  Hand Dominance: ambidextrous   Patient Goals/Specific Activities: reduce pain   OBJECTIVE:   DIAGNOSTIC FINDINGS:  IMPRESSION: 1. Status post posterior fusion and decompression C3-C7, with no residual spinal canal stenosis at these levels. Moderate to severe bilateral neural foraminal narrowing at C6-C7, moderate bilateral neural foraminal narrowing at C4-C5 and C5-C6, and mild to moderate bilateral neural foraminal narrowing C3-C4, similar to prior. 2. C7-T1 severe right and moderate to severe left neural foraminal narrowing, which has likely progressed on the left. 3. C2-C3 mild bilateral neural foraminal narrowing, unchanged.  GENERAL OBSERVATION: Significant rounded shoulders with forward head and slumped posture     SENSATION: Light touch:  Deficits L C8 dermatome   PALPATION: Significant TTP bil UT, bil sub occipitals, bil LS, bil cervical paraspinals  Cervical ROM  ROM ROM  (Eval)  Flexion 10  Extension 4  Right lateral flexion 8  Left lateral flexion 10  Right rotation 10  Left rotation 12  Flexion rotation (normal is 30 degrees)   Flexion rotation (normal is 30 degrees)     (Blank rows = not tested, N = WNL, * = concordant pain)  UPPER EXTREMITY MMT:  MMT Right (Eval) Left (Eval)  Shoulder flexion 4 3+  Shoulder abduction (C5)    Shoulder ER 4 4  Shoulder IR 4 4  Middle trapezius    Lower trapezius    Shoulder extension    Grip strength    Shoulder shrug (C4)    Elbow flexion (C6)    Elbow ext (C7) c c  Thumb ext (C8) c c  Finger abd (T1) c c  Grossly     (Blank rows = not tested, score listed is out of 5 possible points.  N = WNL, D = diminished, C = clear for gross weakness with myotome testing, * = concordant pain with testing)   PATIENT SURVEYS:  Neck Disability Index: 26 / 50 = 52.0 %    TODAY'S TREATMENT:   [facility] Adult PT Treatment:                                                DATE: 03/27/2023  Therapeutic Exercise: UBE 3 min ea fwd/back, level 1  Standing rows, 3 x 10 black band  Standing pull downs, 3 x 10 blue band  Shoulder rolls between sets of standing band exercises Bil shoulder ER with TB - 2x10 - YTB Chin tuck into ball on wall - 2x10 - relieving Supine horizontal abduction, 2 x 10 red band  Supine fly - 2# - 2x10  Manual therapy: Skilled palpation to identify trigger points for TDN STM to all listed muscles following TDN  Trigger Point Dry Needling  Subsequent Treatment: Instructions provided previously at initial dry needling treatment.   Patient Verbal Consent Given: Yes Education Handout Provided: Previously Provided Muscles Treated: bil UT, bil sub occipitals Electrical Stimulation Performed: No Treatment Response/Outcome: twitch      HOME EXERCISE  PROGRAM: Access Code: [email or web address] URL: [email or web address] Date: 03/18/2023 Prepared by: Mauri Reading  Exercises - Seated Scapular Retraction  - 2 x daily - 7 x weekly - 2 sets - 10 reps - 5 sec hold - Seated Thoracic Lumbar Extension  - 2 x daily - 7 x weekly - 3 sets - 10 reps - Supine Shoulder Horizontal Abduction with Resistance  - 1 x daily - 7 x weekly - 2 sets - 10 reps - Bilateral Shoulder Flexion Wall Slide with Towel  -  1 x daily - 7 x weekly - 2 sets - 10 reps - 3 sec hold  Treatment priorities   Eval 3/6       Thoracic mobility TDN       Manual/TDN Chin tuck relieving       Gentle periscapular/cervical strengthening General strengthening       Aquatic?                  ASSESSMENT:  CLINICAL IMPRESSION:  03/27/2023: Manuel [name] tolerated session well with no adverse reaction.  We were able to resume resistance exercise today.  Pt able to add volume and/or intensity to several exercises.  HEP updated to include standing chin tucks, which were relieving, and bil ER with scap retraction.  Will continue with combination of TDN and exercise.   EVAL: Manuel [name] is a 63 y.o. male who presents to clinic with signs and sxs consistent with neck and upper thoracic pain.  Pt with long standing history of neck pain with decompression and fusion C3 -> C7.  Recent MRI shows no issue s with past fusion but some neural foraminal stenosis in the lower cervical spine.  He does have some sensory disturbance @ L C8 dermatome which is consistent with imaging.  Overall he shows increased muscle tone and bulk throughout Cx spine, UT, and LS.  He has concurrent low back pain which was not evaluated today d/t time constraints.  Manuel [name] will benefit from skilled therapy to address relevant deficits and improve comfort with completion of daily tasks.    OBJECTIVE IMPAIRMENTS: Pain, cervical ROM, UE strength  ACTIVITY LIMITATIONS: sitting, reading, driving  PERSONAL FACTORS: See medical history  and pertinent history   REHAB POTENTIAL: Fair chronic pain; see past medical history  CLINICAL DECISION MAKING: Evolving/moderate complexity  EVALUATION COMPLEXITY: Moderate   GOALS:   SHORT TERM GOALS: Target date: 04/08/2023   Manuel [name] will be >75% HEP compliant to improve carryover between sessions and facilitate independent management of condition  Evaluation: ongoing Goal status: INITIAL   LONG TERM GOALS: Target date: 05/06/2023   Manuel [name] will self report >/= 50% decrease in pain from evaluation to improve function in daily tasks  Evaluation/Baseline: 9/10 max pain Goal status: INITIAL   2.  Manuel [name] will show a >/= 20 pct improvement in their NDI score (MCID ~20% or 10/50 pts) as a proxy for functional improvement  Evaluation/Baseline: Neck Disability Index: 26 / 50 = 52.0 % Goal status: INITIAL   3.  Manuel [name] will report confidence in self management of condition at time of discharge with advanced HEP  Evaluation/Baseline: unable to self manage Goal status: INITIAL    PLAN: PT FREQUENCY: 1-2x/week  PT DURATION: 8 weeks  PLANNED INTERVENTIONS:  97164- PT Re-evaluation, 97110-Therapeutic exercises, 97530- Therapeutic activity, 97112- Neuromuscular re-education, 97535- Self Care, 16109- Manual therapy, [id number]- Gait training, [id number]- Aquatic Therapy, [phone]- Electrical stimulation (manual), [id number]- Vasopneumatic device, [id number]- Traction (mechanical), [id number]- Ionotophoresis 4mg /ml Dexamethasone, Taping, Dry Needling, Joint manipulation, and Spinal manipulation.   Kimberlee Nearing Nioma Mccubbins PT 03/27/2023 1:09 PM

## 2023-04-01 ENCOUNTER — Ambulatory Visit: Payer: No Typology Code available for payment source | Admitting: Physical Therapy

## 2023-04-01 ENCOUNTER — Encounter: Payer: Self-pay | Admitting: Physical Therapy

## 2023-04-01 DIAGNOSIS — M542 Cervicalgia: Secondary | ICD-10-CM | POA: Diagnosis not present

## 2023-04-01 DIAGNOSIS — M5459 Other low back pain: Secondary | ICD-10-CM

## 2023-04-01 NOTE — Therapy (Signed)
 OUTPATIENT PHYSICAL THERAPY NOTE   Patient Name: Manuel [name] MRN: [id number] DOB:[birth year], 63 y.o., male Today's Date: 04/01/2023   PT End of Session - 04/01/23 1615     Visit Number 6    Number of Visits 15    Date for PT Re-Evaluation 05/06/23    Authorization Type [facility]    PT Start Time 1615    PT Stop Time 1656    PT Time Calculation (min) 41 min    Activity Tolerance Patient tolerated treatment well    Behavior During Therapy WFL for tasks assessed/performed              Past Medical History:  Diagnosis Date   Arthritis    Asthma    CHF (congestive heart failure) (HCC)    Diabetes mellitus without complication (HCC)    Dyspnea    GERD (gastroesophageal reflux disease)    HLD (hyperlipidemia)    Hypertension    Pneumonia    1981   Seasonal allergies    Past Surgical History:  Procedure Laterality Date   CARDIOVASCULAR STRESS TEST  07/31/2020   Nuclear stress test 07/31/20 [facility]): Negative study for ischemia or scar at 96% maximal heart rate predicted by age.  Overall gated LV systolic function was normal, calculated EF 64% at stress and at rest.  No LV dilatation.   COLONOSCOPY     left arm surgery     POSTERIOR CERVICAL FUSION/FORAMINOTOMY N/A 07/16/2021   Procedure: Posterior cervical fusion with lateral mass fixation - Cervical Three-Cervical Seven,, cervical laminectomy  - Cervical Three-Cevical Six;  Surgeon: Tia Alert, MD;  Location: [facility] OR;  Service: Neurosurgery;  Laterality: N/A;   Patient Active Problem List   Diagnosis Date Noted   Wound infection after surgery 07/22/2021   S/P cervical spinal fusion 07/16/2021    PCP: Sherwood Gambler, MD  REFERRING PROVIDER: Arman Bogus, MD  THERAPY DIAG:  Cervicalgia  Other low back pain  REFERRING DIAG: Cervicalgia [M54.2]   Rationale for Evaluation and Treatment:  Rehabilitation  SUBJECTIVE:  PERTINENT PAST HISTORY:  C3 - C7 fusion and decompression (June 2023), CHF,  DM      PRECAUTIONS: None  WEIGHT BEARING RESTRICTIONS No  FALLS:  Has patient [name] in last 6 months? No, Number of falls: 0  MOI/History of condition:  Onset date: Chronic with acute exacerbation over the last few months  SUBJECTIVE STATEMENT  04/01/2023: Pt reports continued pain relief with PT.   Manuel [name] is a 63 y.o. male who presents to clinic with Manuel [name] complaint of lower Cx spine, upper Tx spine pain.  He has had chronic neck pain for years resulting in decompression and fusion of C3-C7 in June of 2023.  He does endorse some N/t in the little finger of his L hand which is intermittent.  He was doing pulmonary rehab but d/c d/t back pain and it lowering his blood sugar.  He has concurrent chronic low back pain.    Red flags:  denies   Pain:  Are you having pain? Yes Pain location: lower Cx/upper Tx spine NPRS scale:  5/10 to 9/10 Aggravating factors: increased activity or sudden movements Relieving factors: rest, medication  Pain description: sharp and aching Stage: Chronic  Occupation: NA  Assistive Device: NA  Hand Dominance: ambidextrous   Patient Goals/Specific Activities: reduce pain   OBJECTIVE:   DIAGNOSTIC FINDINGS:   IMPRESSION: 1. Status post posterior fusion and decompression C3-C7, with no residual spinal canal stenosis  at these levels. Moderate to severe bilateral neural foraminal narrowing at C6-C7, moderate bilateral neural foraminal narrowing at C4-C5 and C5-C6, and mild to moderate bilateral neural foraminal narrowing C3-C4, similar to prior. 2. C7-T1 severe right and moderate to severe left neural foraminal narrowing, which has likely progressed on the left. 3. C2-C3 mild bilateral neural foraminal narrowing, unchanged.  GENERAL OBSERVATION: Significant rounded shoulders with forward head and slumped posture     SENSATION: Light touch: Deficits L C8 dermatome   PALPATION: Significant TTP bil UT, bil sub  occipitals, bil LS, bil cervical paraspinals  Cervical ROM  ROM ROM  (Eval)  Flexion 10  Extension 4  Right lateral flexion 8  Left lateral flexion 10  Right rotation 10  Left rotation 12  Flexion rotation (normal is 30 degrees)   Flexion rotation (normal is 30 degrees)     (Blank rows = not tested, N = WNL, * = concordant pain)  UPPER EXTREMITY MMT:  MMT Right (Eval) Left (Eval)  Shoulder flexion 4 3+  Shoulder abduction (C5)    Shoulder ER 4 4  Shoulder IR 4 4  Middle trapezius    Lower trapezius    Shoulder extension    Grip strength    Shoulder shrug (C4)    Elbow flexion (C6)    Elbow ext (C7) c c  Thumb ext (C8) c c  Finger abd (T1) c c  Grossly     (Blank rows = not tested, score listed is out of 5 possible points.  N = WNL, D = diminished, C = clear for gross weakness with myotome testing, * = concordant pain with testing)   PATIENT SURVEYS:  Neck Disability Index: 26 / 50 = 52.0 %    TODAY'S TREATMENT:   [facility] Adult PT Treatment:                                                DATE: 04/01/2023  Therapeutic Exercise: UBE 3 min ea fwd/back, level 1  Standing rows, 3x10, 13# Standing pull downs, 3 x 10 blue band  Shoulder rolls between sets of standing band exercises Chin tuck into ball on wall - 2x10 - relieving - combined with above Supine horizontal abduction, 2 x 10 GTB band and on foam roller Bil shoulder ER with TB - 3x10 - GTB Supine fly - 2# - 2x10 (not today)  Manual therapy: Skilled palpation to identify trigger points for TDN STM to all listed muscles following TDN  Trigger Point Dry Needling  Subsequent Treatment: Instructions provided previously at initial dry needling treatment.   Patient Verbal Consent Given: Yes Education Handout Provided: Previously Provided Muscles Treated: bil UT, bil sub occipitals Electrical Stimulation Performed: No Treatment Response/Outcome: twitch      HOME EXERCISE PROGRAM: Access Code:  [email or web address] URL: [email or web address] Date: 03/18/2023 Prepared by: Mauri Reading  Exercises - Seated Scapular Retraction  - 2 x daily - 7 x weekly - 2 sets - 10 reps - 5 sec hold - Seated Thoracic Lumbar Extension  - 2 x daily - 7 x weekly - 3 sets - 10 reps - Supine Shoulder Horizontal Abduction with Resistance  - 1 x daily - 7 x weekly - 2 sets - 10 reps - Bilateral Shoulder Flexion Wall Slide with Towel  - 1 x daily - 7 x weekly - 2 sets -  10 reps - 3 sec hold  Treatment priorities   Eval 3/6       Thoracic mobility TDN       Manual/TDN Chin tuck relieving       Gentle periscapular/cervical strengthening General strengthening       Aquatic?                  ASSESSMENT:  CLINICAL IMPRESSION:  04/01/2023: Manuel [name] tolerated session well with no adverse reaction.  We continue to build on cervical and periscapular strengthening.  Able to combine chin tuck at wall with bil ER which required mod cuing for form.  Pt continues to respond very well to TDN and reports improved mobility and reduced pain both in and out of clinic.  Will continue with combination of TDN and exercise moving forward.   EVAL: Manuel [name] is a 63 y.o. male who presents to clinic with signs and sxs consistent with neck and upper thoracic pain.  Pt with long standing history of neck pain with decompression and fusion C3 -> C7.  Recent MRI shows no issue s with past fusion but some neural foraminal stenosis in the lower cervical spine.  He does have some sensory disturbance @ L C8 dermatome which is consistent with imaging.  Overall he shows increased muscle tone and bulk throughout Cx spine, UT, and LS.  He has concurrent low back pain which was not evaluated today d/t time constraints.  Manuel [name] will benefit from skilled therapy to address relevant deficits and improve comfort with completion of daily tasks.    OBJECTIVE IMPAIRMENTS: Pain, cervical ROM, UE strength  ACTIVITY LIMITATIONS: sitting, reading,  driving  PERSONAL FACTORS: See medical history and pertinent history   REHAB POTENTIAL: Fair chronic pain; see past medical history  CLINICAL DECISION MAKING: Evolving/moderate complexity  EVALUATION COMPLEXITY: Moderate   GOALS:   SHORT TERM GOALS: Target date: 04/08/2023   [place] will be >75% HEP compliant to improve carryover between sessions and facilitate independent management of condition  Evaluation: ongoing Goal status: MET   LONG TERM GOALS: Target date: 05/06/2023   Manuel [name] will self report >/= 50% decrease in pain from evaluation to improve function in daily tasks  Evaluation/Baseline: 9/10 max pain Goal status: INITIAL   2.  Manuel [name] will show a >/= 20 pct improvement in their NDI score (MCID ~20% or 10/50 pts) as a proxy for functional improvement  Evaluation/Baseline: Neck Disability Index: 26 / 50 = 52.0 % Goal status: INITIAL   3.  Manuel [name] will report confidence in self management of condition at time of discharge with advanced HEP  Evaluation/Baseline: unable to self manage Goal status: INITIAL    PLAN: PT FREQUENCY: 1-2x/week  PT DURATION: 8 weeks  PLANNED INTERVENTIONS:  97164- PT Re-evaluation, 97110-Therapeutic exercises, 97530- Therapeutic activity, 97112- Neuromuscular re-education, 97535- Self Care, 02725- Manual therapy, [id number]- Gait training, [id number]- Aquatic Therapy, [phone]- Electrical stimulation (manual), [id number]- Vasopneumatic device, [id number]- Traction (mechanical), [id number]- Ionotophoresis 4mg /ml Dexamethasone, Taping, Dry Needling, Joint manipulation, and Spinal manipulation.   Kimberlee Nearing Shamecka Hocutt PT 04/01/2023 4:58 PM

## 2023-04-02 NOTE — Therapy (Unsigned)
 OUTPATIENT PHYSICAL THERAPY NOTE   Patient Name: Manuel [name] MRN: [id number] DOB:1960/06/30, 63 y.o., male Today's Date: 04/03/2023   PT End of Session - 04/03/23 1350     Visit Number 7    Number of Visits 15    Date for PT Re-Evaluation 05/06/23    Authorization Type [facility]    PT Start Time 1350    Activity Tolerance Patient tolerated treatment well    Behavior During Therapy Manuel [name] for tasks assessed/performed               Past Medical History:  Diagnosis Date   Arthritis    Asthma    CHF (congestive heart failure) (HCC)    Diabetes mellitus without complication (HCC)    Dyspnea    GERD (gastroesophageal reflux disease)    HLD (hyperlipidemia)    Hypertension    Pneumonia    1981   Seasonal allergies    Past Surgical History:  Procedure Laterality Date   CARDIOVASCULAR STRESS TEST  07/31/2020   Nuclear stress test 07/31/20 [facility]): Negative study for ischemia or scar at 96% maximal heart rate predicted by age.  Overall gated LV systolic function was normal, calculated EF 64% at stress and at rest.  No LV dilatation.   COLONOSCOPY     left arm surgery     POSTERIOR CERVICAL FUSION/FORAMINOTOMY N/A 07/16/2021   Procedure: Posterior cervical fusion with lateral mass fixation - Cervical Three-Cervical Seven,, cervical laminectomy  - Cervical Three-Cevical Six;  Surgeon: Manuel Alert, MD;  Location: [facility] OR;  Service: Neurosurgery;  Laterality: N/A;   Patient Active Problem List   Diagnosis Date Noted   Wound infection after surgery 07/22/2021   S/P cervical spinal fusion 07/16/2021    PCP: Manuel Gambler, MD  REFERRING PROVIDER: Arman Bogus, MD  THERAPY DIAG:  Cervicalgia  Other low back pain  Muscle weakness  REFERRING DIAG: Cervicalgia [M54.2]   Rationale for Evaluation and Treatment:  Rehabilitation  SUBJECTIVE:  PERTINENT PAST HISTORY:  C3 - C7 fusion and decompression (June 2023), CHF, DM      PRECAUTIONS: None  WEIGHT  BEARING RESTRICTIONS No  FALLS:  [name] patient fallen in last 6 months? No, Number of falls: 0  MOI/History of condition:  Onset date: Chronic with acute exacerbation over the last few months  SUBJECTIVE STATEMENT  04/03/2023: Patient describes pain/discomfort in B upper traps and SO's.   Manuel [name] is a 63 y.o. male who presents to clinic with chief complaint of lower Cx spine, upper Tx spine pain.  He [name] had chronic neck pain for years resulting in decompression and fusion of C3-C7 in June of 2023.  He does endorse some N/t in the little finger of his L hand which is intermittent.  He was doing pulmonary rehab but d/c d/t back pain and it lowering his blood sugar.  He [name] concurrent chronic low back pain.    Red flags:  denies   Pain:  Are you having pain? Yes Pain location: lower Cx/upper Tx spine NPRS scale:  5/10 to 9/10 Aggravating factors: increased activity or sudden movements Relieving factors: rest, medication  Pain description: sharp and aching Stage: Chronic  Occupation: NA  Assistive Device: NA  Hand Dominance: ambidextrous   Patient Goals/Specific Activities: reduce pain   OBJECTIVE:   DIAGNOSTIC FINDINGS:   IMPRESSION: 1. Status post posterior fusion and decompression C3-C7, with no residual spinal canal stenosis at these levels. Moderate to severe bilateral neural foraminal narrowing  at C6-C7, moderate bilateral neural foraminal narrowing at C4-C5 and C5-C6, and mild to moderate bilateral neural foraminal narrowing C3-C4, similar to prior. 2. C7-T1 severe right and moderate to severe left neural foraminal narrowing, which [name] likely progressed on the left. 3. C2-C3 mild bilateral neural foraminal narrowing, unchanged.  GENERAL OBSERVATION: Significant rounded shoulders with forward head and slumped posture     SENSATION: Light touch: Deficits L C8 dermatome   PALPATION: Significant TTP bil UT, bil sub occipitals, bil LS, bil  cervical paraspinals  Cervical ROM  ROM ROM  (Eval)  Flexion 10  Extension 4  Right lateral flexion 8  Left lateral flexion 10  Right rotation 10  Left rotation 12  Flexion rotation (normal is 30 degrees)   Flexion rotation (normal is 30 degrees)     (Blank rows = not tested, N = WNL, * = concordant pain)  UPPER EXTREMITY MMT:  MMT Right (Eval) Left (Eval)  Shoulder flexion 4 3+  Shoulder abduction (C5)    Shoulder ER 4 4  Shoulder IR 4 4  Middle trapezius    Lower trapezius    Shoulder extension    Grip strength    Shoulder shrug (C4)    Elbow flexion (C6)    Elbow ext (C7) c c  Thumb ext (C8) c c  Finger abd (T1) c c  Grossly     (Blank rows = not tested, score listed is out of 5 possible points.  N = WNL, D = diminished, C = clear for gross weakness with myotome testing, * = concordant pain with testing)   PATIENT SURVEYS:  Neck Disability Index: 26 / 50 = 52.0 %    TODAY'S TREATMENT:  [facility] Adult PT Treatment:                                                DATE: 04/03/23 Therapeutic Exercise: UBE L1.5 3/3 min Manual Therapy: Skilled palpation to identify taught and irritable bands in B UT and B rectus capitis  Trigger Point Dry Needling  Subsequent Treatment: Instructions provided previously at initial dry needling treatment.   Patient Verbal Consent Given: Yes Education Handout Provided: Previously Provided Muscles Treated: B UT and B rectus capitis Electrical Stimulation Performed: No Treatment Response/Outcome: less tissue tension and decreased pain reported as well as improved mobility in cervical region   Therapeutic Activity: Omega row high 20# 15x2 Omega row low 20# 15x2 Omega Lat pulldown 20# 15x2 Supine alt UE OH flexion 15/15 (Head on 1/2 roll for SO release) S/L open book 10/10  [facility] Adult PT Treatment:                                                DATE: 04/03/2023   Therapeutic Exercise: UBE 3 min ea fwd/back, level 1  Standing  rows, 3x10, 13# Standing pull downs, 3 x 10 blue band  Shoulder rolls between sets of standing band exercises Chin tuck into ball on wall - 2x10 - relieving - combined with above Supine horizontal abduction, 2 x 10 GTB band and on foam roller Bil shoulder ER with TB - 3x10 - GTB Supine fly - 2# - 2x10 (not today)  Manual therapy: Skilled palpation to  identify trigger points for TDN STM to all listed muscles following TDN  Trigger Point Dry Needling  Subsequent Treatment: Instructions provided previously at initial dry needling treatment.   Patient Verbal Consent Given: Yes Education Handout Provided: Previously Provided Muscles Treated: bil UT, bil sub occipitals Electrical Stimulation Performed: No Treatment Response/Outcome: twitch      HOME EXERCISE PROGRAM: Access Code: [email or web address] URL: [email or web address] Date: 03/18/2023 Prepared by: Manuel [name]  Exercises - Seated Scapular Retraction  - 2 x daily - 7 x weekly - 2 sets - 10 reps - 5 sec hold - Seated Thoracic Lumbar Extension  - 2 x daily - 7 x weekly - 3 sets - 10 reps - Supine Shoulder Horizontal Abduction with Resistance  - 1 x daily - 7 x weekly - 2 sets - 10 reps - Bilateral Shoulder Flexion Wall Slide with Towel  - 1 x daily - 7 x weekly - 2 sets - 10 reps - 3 sec hold  Treatment priorities   Eval 3/6       Thoracic mobility TDN       Manual/TDN Chin tuck relieving       Gentle periscapular/cervical strengthening General strengthening       Aquatic?                  ASSESSMENT:  CLINICAL IMPRESSION:    Introduced isotonic postural retraining exercises this session, high reps low weight, monitoring form for proper body mechanics.  Supine tasks included open book for t-spine mobility as well as alternating UE tasks to mobilize lower cervical spine.  Able to tolerate all new tasks w/o exacerbating symptoms.  EVAL: Manuel [name] is a 63 y.o. male who presents to clinic with signs and sxs  consistent with neck and upper thoracic pain.  Pt with long standing history of neck pain with decompression and fusion C3 -> C7.  Recent MRI shows no issue s with past fusion but some neural foraminal stenosis in the lower cervical spine.  He does have some sensory disturbance @ L C8 dermatome which is consistent with imaging.  Overall he shows increased muscle tone and bulk throughout Cx spine, UT, and LS.  He [name] concurrent low back pain which was not evaluated today d/t time constraints.  Manuel [name] will benefit from skilled therapy to address relevant deficits and improve comfort with completion of daily tasks.    OBJECTIVE IMPAIRMENTS: Pain, cervical ROM, UE strength  ACTIVITY LIMITATIONS: sitting, [name], driving  PERSONAL FACTORS: See medical history and pertinent history   REHAB POTENTIAL: Fair chronic pain; see past medical history  CLINICAL DECISION MAKING: Evolving/moderate complexity  EVALUATION COMPLEXITY: Moderate   GOALS:   SHORT TERM GOALS: Target date: 04/08/2023   Manuel [name] will be >75% HEP compliant to improve carryover between sessions and facilitate independent management of condition  Evaluation: ongoing Goal status: MET   LONG TERM GOALS: Target date: 05/06/2023   Manuel [name] will self report >/= 50% decrease in pain from evaluation to improve function in daily tasks  Evaluation/Baseline: 9/10 max pain Goal status: INITIAL   2.  Manuel [name] will show a >/= 20 pct improvement in their NDI score (MCID ~20% or 10/50 pts) as a proxy for functional improvement  Evaluation/Baseline: Neck Disability Index: 26 / 50 = 52.0 % Goal status: INITIAL   3.  Manuel [name] will report confidence in self management of condition at time of discharge with advanced HEP  Evaluation/Baseline: unable to self manage Goal status: INITIAL    PLAN: PT FREQUENCY:  1-2x/week  PT DURATION: 8 weeks  PLANNED INTERVENTIONS:  97164- PT Re-evaluation, 97110-Therapeutic exercises, 97530-  Therapeutic activity, [id number]- Neuromuscular re-education, 97535- Self Care, 16109- Manual therapy, [id number]- Gait training, [phone]- Aquatic Therapy, [phone]- Electrical stimulation (manual), [id number]- Vasopneumatic device, [id number]- Traction (mechanical), [id number]- Ionotophoresis 4mg /ml Dexamethasone, Taping, Dry Needling, Joint manipulation, and Spinal manipulation.   Manuel [name] Manuel [name] PT 04/03/2023 2:58 PM

## 2023-04-03 ENCOUNTER — Ambulatory Visit: Payer: No Typology Code available for payment source

## 2023-04-03 DIAGNOSIS — M6281 Muscle weakness (generalized): Secondary | ICD-10-CM

## 2023-04-03 DIAGNOSIS — M5459 Other low back pain: Secondary | ICD-10-CM

## 2023-04-03 DIAGNOSIS — M542 Cervicalgia: Secondary | ICD-10-CM | POA: Diagnosis not present

## 2023-04-08 ENCOUNTER — Encounter: Payer: Self-pay | Admitting: Physical Therapy

## 2023-04-08 ENCOUNTER — Ambulatory Visit: Payer: No Typology Code available for payment source | Admitting: Physical Therapy

## 2023-04-08 DIAGNOSIS — M542 Cervicalgia: Secondary | ICD-10-CM

## 2023-04-08 DIAGNOSIS — R2681 Unsteadiness on feet: Secondary | ICD-10-CM

## 2023-04-08 DIAGNOSIS — M6281 Muscle weakness (generalized): Secondary | ICD-10-CM

## 2023-04-08 DIAGNOSIS — M5459 Other low back pain: Secondary | ICD-10-CM

## 2023-04-08 NOTE — Therapy (Signed)
 OUTPATIENT PHYSICAL THERAPY NOTE   Patient Name: Manuel [name] MRN: [id number] DOB:1960/04/21, 63 y.o., male Today's Date: 04/08/2023   PT End of Session - 04/08/23 1216     Visit Number 8    Number of Visits 15    Date for PT Re-Evaluation 05/06/23    Authorization Type [facility]    PT Start Time 1215    PT Stop Time 1257    PT Time Calculation (min) 42 min    Activity Tolerance Patient tolerated treatment well    Behavior During Therapy WFL for tasks assessed/performed               Past Medical History:  Diagnosis Date   Arthritis    Asthma    CHF (congestive heart failure) (HCC)    Diabetes mellitus without complication (HCC)    Dyspnea    GERD (gastroesophageal reflux disease)    HLD (hyperlipidemia)    Hypertension    Pneumonia    1981   Seasonal allergies    Past Surgical History:  Procedure Laterality Date   CARDIOVASCULAR STRESS TEST  07/31/2020   Nuclear stress test 07/31/20 [facility]): Negative study for ischemia or scar at 96% maximal heart rate predicted by age.  Overall gated LV systolic function was normal, calculated EF 64% at stress and at rest.  No LV dilatation.   COLONOSCOPY     left arm surgery     POSTERIOR CERVICAL FUSION/FORAMINOTOMY N/A 07/16/2021   Procedure: Posterior cervical fusion with lateral mass fixation - Cervical Three-Cervical Seven,, cervical laminectomy  - Cervical Three-Cevical Six;  Surgeon: Tia Alert, MD;  Location: [facility] OR;  Service: Neurosurgery;  Laterality: N/A;   Patient Active Problem List   Diagnosis Date Noted   Wound infection after surgery 07/22/2021   S/P cervical spinal fusion 07/16/2021    PCP: Sherwood Gambler, MD  REFERRING PROVIDER: Arman Bogus, MD  THERAPY DIAG:  Cervicalgia  Other low back pain  Muscle weakness  Unsteadiness on feet  REFERRING DIAG: Cervicalgia [M54.2]   Rationale for Evaluation and Treatment:  Rehabilitation  SUBJECTIVE:  PERTINENT PAST HISTORY:  C3 - C7  fusion and decompression (June 2023), CHF, DM      PRECAUTIONS: None  WEIGHT BEARING RESTRICTIONS No  FALLS:  Has patient fallen in last 6 months? No, Number of falls: 0  MOI/History of condition:  Onset date: Chronic with acute exacerbation over the last few months  SUBJECTIVE STATEMENT  04/08/2023: Pt reports increased pain in his neck and UT today.   Manuel [name] is a 63 y.o. male who presents to clinic with chief complaint of lower Cx spine, upper Tx spine pain.  He has had chronic neck pain for years resulting in decompression and fusion of C3-C7 in June of 2023.  He does endorse some N/t in the little finger of his L hand which is intermittent.  He was doing pulmonary rehab but d/c d/t back pain and it lowering his blood sugar.  He has concurrent chronic low back pain.    Red flags:  denies   Pain:  Are you having pain? Yes Pain location: lower Cx/upper Tx spine NPRS scale:  5/10 to 9/10 Aggravating factors: increased activity or sudden movements Relieving factors: rest, medication  Pain description: sharp and aching Stage: Chronic  Occupation: NA  Assistive Device: NA  Hand Dominance: ambidextrous   Patient Goals/Specific Activities: reduce pain   OBJECTIVE:   DIAGNOSTIC FINDINGS:   IMPRESSION: 1. Status post  posterior fusion and decompression C3-C7, with no residual spinal canal stenosis at these levels. Moderate to severe bilateral neural foraminal narrowing at C6-C7, moderate bilateral neural foraminal narrowing at C4-C5 and C5-C6, and mild to moderate bilateral neural foraminal narrowing C3-C4, similar to prior. 2. C7-T1 severe right and moderate to severe left neural foraminal narrowing, which has likely progressed on the left. 3. C2-C3 mild bilateral neural foraminal narrowing, unchanged.  GENERAL OBSERVATION: Significant rounded shoulders with forward head and slumped posture     SENSATION: Light touch: Deficits L C8  dermatome   PALPATION: Significant TTP bil UT, bil sub occipitals, bil LS, bil cervical paraspinals  Cervical ROM  ROM ROM  (Eval)  Flexion 10  Extension 4  Right lateral flexion 8  Left lateral flexion 10  Right rotation 10  Left rotation 12  Flexion rotation (normal is 30 degrees)   Flexion rotation (normal is 30 degrees)     (Blank rows = not tested, N = WNL, * = concordant pain)  UPPER EXTREMITY MMT:  MMT Right (Eval) Left (Eval)  Shoulder flexion 4 3+  Shoulder abduction (C5)    Shoulder ER 4 4  Shoulder IR 4 4  Middle trapezius    Lower trapezius    Shoulder extension    Grip strength    Shoulder shrug (C4)    Elbow flexion (C6)    Elbow ext (C7) c c  Thumb ext (C8) c c  Finger abd (T1) c c  Grossly     (Blank rows = not tested, score listed is out of 5 possible points.  N = WNL, D = diminished, C = clear for gross weakness with myotome testing, * = concordant pain with testing)   PATIENT SURVEYS:  Neck Disability Index: 26 / 50 = 52.0 %    TODAY'S TREATMENT:   [facility] Adult PT Treatment:                                                DATE: 04/08/2023   Manual therapy: Skilled palpation to identify trigger points for TDN STM to all listed muscles following TDN SO release STM cervical paraspinals STM bil UT STM R scalenes  Trigger Point Dry Needling  Subsequent Treatment: Instructions provided previously at initial dry needling treatment.   Patient Verbal Consent Given: Yes Education Handout Provided: Previously Provided Muscles Treated: bil UT, bil sub occipitals, bil tx paraspinals ~T4 Electrical Stimulation Performed: No Treatment Response/Outcome: twitch      HOME EXERCISE PROGRAM: Access Code: [email or web address] URL: [email or web address] Date: 03/18/2023 Prepared by: Mauri Reading  Exercises - Seated Scapular Retraction  - 2 x daily - 7 x weekly - 2 sets - 10 reps - 5 sec hold - Seated Thoracic Lumbar Extension  - 2 x daily -  7 x weekly - 3 sets - 10 reps - Supine Shoulder Horizontal Abduction with Resistance  - 1 x daily - 7 x weekly - 2 sets - 10 reps - Bilateral Shoulder Flexion Wall Slide with Towel  - 1 x daily - 7 x weekly - 2 sets - 10 reps - 3 sec hold  Treatment priorities   Eval 3/6       Thoracic mobility TDN       Manual/TDN Chin tuck relieving       Gentle periscapular/cervical strengthening General strengthening  Aquatic?                  ASSESSMENT:  CLINICAL IMPRESSION:    Norton tolerated session well with no adverse reaction.  Pt with high levels of pain today so concentrated on manual therapy and TDN.  Pt with 8/10 pain when starting session with reduction to 5/10 following therapy.  EVAL: Malin is a 63 y.o. male who presents to clinic with signs and sxs consistent with neck and upper thoracic pain.  Pt with long standing history of neck pain with decompression and fusion C3 -> C7.  Recent MRI shows no issue s with past fusion but some neural foraminal stenosis in the lower cervical spine.  He does have some sensory disturbance @ L C8 dermatome which is consistent with imaging.  Overall he shows increased muscle tone and bulk throughout Cx spine, UT, and LS.  He has concurrent low back pain which was not evaluated today d/t time constraints.  Karina will benefit from skilled therapy to address relevant deficits and improve comfort with completion of daily tasks.    OBJECTIVE IMPAIRMENTS: Pain, cervical ROM, UE strength  ACTIVITY LIMITATIONS: sitting, reading, driving  PERSONAL FACTORS: See medical history and pertinent history   REHAB POTENTIAL: Fair chronic pain; see past medical history  CLINICAL DECISION MAKING: Evolving/moderate complexity  EVALUATION COMPLEXITY: Moderate   GOALS:   SHORT TERM GOALS: Target date: 04/08/2023   Elliot will be >75% HEP compliant to improve carryover between sessions and facilitate independent management of condition  Evaluation:  ongoing Goal status: MET   LONG TERM GOALS: Target date: 05/06/2023   Damondre will self report >/= 50% decrease in pain from evaluation to improve function in daily tasks  Evaluation/Baseline: 9/10 max pain Goal status: INITIAL   2.  Roshawn will show a >/= 20 pct improvement in their NDI score (MCID ~20% or 10/50 pts) as a proxy for functional improvement  Evaluation/Baseline: Neck Disability Index: 26 / 50 = 52.0 % Goal status: INITIAL   3.  Orin will report confidence in self management of condition at time of discharge with advanced HEP  Evaluation/Baseline: unable to self manage Goal status: INITIAL    PLAN: PT FREQUENCY: 1-2x/week  PT DURATION: 8 weeks  PLANNED INTERVENTIONS:  97164- PT Re-evaluation, 97110-Therapeutic exercises, 97530- Therapeutic activity, 97112- Neuromuscular re-education, 97535- Self Care, 78295- Manual therapy, [id number]- Gait training, [id number]- Aquatic Therapy, [phone]- Electrical stimulation (manual), [id number]- Vasopneumatic device, [id number]- Traction (mechanical), [id number]- Ionotophoresis 4mg /ml Dexamethasone, Taping, Dry Needling, Joint manipulation, and Spinal manipulation.   Kimberlee Nearing Jozef Eisenbeis PT 04/08/2023 2:07 PM

## 2023-04-10 ENCOUNTER — Ambulatory Visit: Payer: No Typology Code available for payment source | Admitting: Physical Therapy

## 2023-04-18 ENCOUNTER — Encounter: Payer: Self-pay | Admitting: Physical Therapy

## 2023-04-18 ENCOUNTER — Ambulatory Visit: Payer: Self-pay | Admitting: Physical Therapy

## 2023-04-18 DIAGNOSIS — M5459 Other low back pain: Secondary | ICD-10-CM

## 2023-04-18 DIAGNOSIS — M6281 Muscle weakness (generalized): Secondary | ICD-10-CM

## 2023-04-18 DIAGNOSIS — M542 Cervicalgia: Secondary | ICD-10-CM

## 2023-04-18 DIAGNOSIS — R2681 Unsteadiness on feet: Secondary | ICD-10-CM

## 2023-04-18 NOTE — Therapy (Signed)
 OUTPATIENT PHYSICAL THERAPY NOTE   Patient Name: Manuel [name] MRN: [id number] DOB:[birth year], 63 y.o., male Today's Date: 04/19/2023   PT End of Session - 04/18/23 1125     Visit Number 9    Number of Visits 15    Date for PT Re-Evaluation 05/06/23    Authorization Type [place]    PT Start Time 1125    PT Stop Time 1205    PT Time Calculation (min) 40 min    Activity Tolerance Patient tolerated treatment well    Behavior During Therapy WFL for tasks assessed/performed               Past Medical History:  Diagnosis Date   Arthritis    Asthma    CHF (congestive heart failure) (HCC)    Diabetes mellitus without complication (HCC)    Dyspnea    GERD (gastroesophageal reflux disease)    HLD (hyperlipidemia)    Hypertension    Pneumonia    1981   Seasonal allergies    Past Surgical History:  Procedure Laterality Date   CARDIOVASCULAR STRESS TEST  07/31/2020   Nuclear stress test 07/31/20 [facility]): Negative study for ischemia or scar at 96% maximal heart rate predicted by age.  Overall gated LV systolic function was normal, calculated EF 64% at stress and at rest.  No LV dilatation.   COLONOSCOPY     left arm surgery     POSTERIOR CERVICAL FUSION/FORAMINOTOMY N/A 07/16/2021   Procedure: Posterior cervical fusion with lateral mass fixation - Cervical Three-Cervical Seven,, cervical laminectomy  - Cervical Three-Cevical Six;  Surgeon: Tia Alert, MD;  Location: [facility] OR;  Service: Neurosurgery;  Laterality: N/A;   Patient Active Problem List   Diagnosis Date Noted   Wound infection after surgery 07/22/2021   S/P cervical spinal fusion 07/16/2021    PCP: Sherwood Gambler, MD  REFERRING PROVIDER: Arman Bogus, MD  THERAPY DIAG:  Cervicalgia  Other low back pain  Muscle weakness  Unsteadiness on feet  REFERRING DIAG: Cervicalgia [M54.2]   Rationale for Evaluation and Treatment:  Rehabilitation  SUBJECTIVE:  PERTINENT PAST HISTORY:  C3 - C7  fusion and decompression (June 2023), CHF, DM      PRECAUTIONS: None  WEIGHT BEARING RESTRICTIONS No  FALLS:  Has patient fallen in last 6 months? No, Number of falls: 0  MOI/History of condition:  Onset date: Chronic with acute exacerbation over the last few months  SUBJECTIVE STATEMENT  04/19/2023: Pt reports his sxs are variable but overall is improving with PT.   Manuel [name] is a 63 y.o. male who presents to clinic with chief complaint of lower Cx spine, upper Tx spine pain.  He has had chronic neck pain for years resulting in decompression and fusion of C3-C7 in June of 2023.  He does endorse some N/t in the little finger of his L hand which is intermittent.  He was doing pulmonary rehab but d/c d/t back pain and it lowering his blood sugar.  He has concurrent chronic low back pain.    Red flags:  denies   Pain:  Are you having pain? Yes Pain location: lower Cx/upper Tx spine NPRS scale:  5/10 to 9/10 Aggravating factors: increased activity or sudden movements Relieving factors: rest, medication  Pain description: sharp and aching Stage: Chronic  Occupation: NA  Assistive Device: NA  Hand Dominance: ambidextrous   Patient Goals/Specific Activities: reduce pain   OBJECTIVE:   DIAGNOSTIC FINDINGS:   IMPRESSION: 1.  Status post posterior fusion and decompression C3-C7, with no residual spinal canal stenosis at these levels. Moderate to severe bilateral neural foraminal narrowing at C6-C7, moderate bilateral neural foraminal narrowing at C4-C5 and C5-C6, and mild to moderate bilateral neural foraminal narrowing C3-C4, similar to prior. 2. C7-T1 severe right and moderate to severe left neural foraminal narrowing, which has likely progressed on the left. 3. C2-C3 mild bilateral neural foraminal narrowing, unchanged.  GENERAL OBSERVATION: Significant rounded shoulders with forward head and slumped posture     SENSATION: Light touch: Deficits L C8  dermatome   PALPATION: Significant TTP bil UT, bil sub occipitals, bil LS, bil cervical paraspinals  Cervical ROM  ROM ROM  (Eval)  Flexion 10  Extension 4  Right lateral flexion 8  Left lateral flexion 10  Right rotation 10  Left rotation 12  Flexion rotation (normal is 30 degrees)   Flexion rotation (normal is 30 degrees)     (Blank rows = not tested, N = WNL, * = concordant pain)  UPPER EXTREMITY MMT:  MMT Right (Eval) Left (Eval)  Shoulder flexion 4 3+  Shoulder abduction (C5)    Shoulder ER 4 4  Shoulder IR 4 4  Middle trapezius    Lower trapezius    Shoulder extension    Grip strength    Shoulder shrug (C4)    Elbow flexion (C6)    Elbow ext (C7) c c  Thumb ext (C8) c c  Finger abd (T1) c c  Grossly     (Blank rows = not tested, score listed is out of 5 possible points.  N = WNL, D = diminished, C = clear for gross weakness with myotome testing, * = concordant pain with testing)   PATIENT SURVEYS:  Neck Disability Index: 26 / 50 = 52.0 %    TODAY'S TREATMENT:   [facility] Adult PT Treatment:                                                DATE: 04/19/2023   Therex: UBE 3 min ea fwd/back, level 1  Standing rows, 3x10, 13# Standing pull downs, 13# Shoulder rolls between sets of standing band exercises Chin tuck into ball on wall - 2x10 - relieving - combined with Supine horizontal abduction, 2 x 10 GTB Bil shoulder ER with TB - 3x10 - GTB  Manual therapy: Skilled palpation to identify trigger points for TDN STM to all listed muscles following TDN  Trigger Point Dry Needling  Subsequent Treatment: Instructions provided previously at initial dry needling treatment.   Patient Verbal Consent Given: Yes Education Handout Provided: Previously Provided Muscles Treated: bil UT, bil sub occipitals, bil tx paraspinals ~T4 Electrical Stimulation Performed: No Treatment Response/Outcome: twitch      HOME EXERCISE PROGRAM: Access Code: [email or web address] URL:  [email or web address] Date: 03/18/2023 Prepared by: Mauri Reading  Exercises - Seated Scapular Retraction  - 2 x daily - 7 x weekly - 2 sets - 10 reps - 5 sec hold - Seated Thoracic Lumbar Extension  - 2 x daily - 7 x weekly - 3 sets - 10 reps - Supine Shoulder Horizontal Abduction with Resistance  - 1 x daily - 7 x weekly - 2 sets - 10 reps - Bilateral Shoulder Flexion Wall Slide with Towel  - 1 x daily - 7 x weekly - 2 sets - 10  reps - 3 sec hold  Treatment priorities   Eval 3/6       Thoracic mobility TDN       Manual/TDN Chin tuck relieving       Gentle periscapular/cervical strengthening General strengthening       Aquatic?                  ASSESSMENT:  CLINICAL IMPRESSION:    Braison tolerated session well with no adverse reaction.  Pt with relatively lower levels of pain today.  We were able to resume therex in addition to TDN and manual.  Pt reports significant fatigue with periscapular exercises.  Pt requires mod cuing to reduce over activation of bil UT with row.  Pt reports significant pain relief following therapy.  EVAL: Sire is a 63 y.o. male who presents to clinic with signs and sxs consistent with neck and upper thoracic pain.  Pt with long standing history of neck pain with decompression and fusion C3 -> C7.  Recent MRI shows no issue s with past fusion but some neural foraminal stenosis in the lower cervical spine.  He does have some sensory disturbance @ L C8 dermatome which is consistent with imaging.  Overall he shows increased muscle tone and bulk throughout Cx spine, UT, and LS.  He has concurrent low back pain which was not evaluated today d/t time constraints.  Cadarius will benefit from skilled therapy to address relevant deficits and improve comfort with completion of daily tasks.    OBJECTIVE IMPAIRMENTS: Pain, cervical ROM, UE strength  ACTIVITY LIMITATIONS: sitting, reading, driving  PERSONAL FACTORS: See medical history and pertinent  history   REHAB POTENTIAL: Fair chronic pain; see past medical history  CLINICAL DECISION MAKING: Evolving/moderate complexity  EVALUATION COMPLEXITY: Moderate   GOALS:   SHORT TERM GOALS: Target date: 04/08/2023   Kevion will be >75% HEP compliant to improve carryover between sessions and facilitate independent management of condition  Evaluation: ongoing Goal status: MET   LONG TERM GOALS: Target date: 05/06/2023   Paiden will self report >/= 50% decrease in pain from evaluation to improve function in daily tasks  Evaluation/Baseline: 9/10 max pain Goal status: INITIAL   2.  Yuma will show a >/= 20 pct improvement in their NDI score (MCID ~20% or 10/50 pts) as a proxy for functional improvement  Evaluation/Baseline: Neck Disability Index: 26 / 50 = 52.0 % Goal status: INITIAL   3.  Jemmie will report confidence in self management of condition at time of discharge with advanced HEP  Evaluation/Baseline: unable to self manage Goal status: INITIAL    PLAN: PT FREQUENCY: 1-2x/week  PT DURATION: 8 weeks  PLANNED INTERVENTIONS:  97164- PT Re-evaluation, 97110-Therapeutic exercises, 97530- Therapeutic activity, 97112- Neuromuscular re-education, 97535- Self Care, 78469- Manual therapy, [id number]- Gait training, [id number]- Aquatic Therapy, [phone]- Electrical stimulation (manual), [id number]- Vasopneumatic device, [id number]- Traction (mechanical), [id number]- Ionotophoresis 4mg /ml Dexamethasone, Taping, Dry Needling, Joint manipulation, and Spinal manipulation.   Kimberlee Nearing Tighe Gitto PT 04/19/2023 8:14 AM

## 2023-04-23 ENCOUNTER — Other Ambulatory Visit (HOSPITAL_COMMUNITY): Payer: Self-pay

## 2023-04-25 ENCOUNTER — Ambulatory Visit: Payer: Self-pay | Admitting: Physical Therapy

## 2023-05-02 ENCOUNTER — Encounter: Payer: Self-pay | Admitting: Physical Therapy

## 2023-05-02 ENCOUNTER — Ambulatory Visit: Payer: Self-pay | Attending: Neurological Surgery | Admitting: Physical Therapy

## 2023-05-02 DIAGNOSIS — R2681 Unsteadiness on feet: Secondary | ICD-10-CM | POA: Insufficient documentation

## 2023-05-02 DIAGNOSIS — M5459 Other low back pain: Secondary | ICD-10-CM | POA: Diagnosis present

## 2023-05-02 DIAGNOSIS — M6281 Muscle weakness (generalized): Secondary | ICD-10-CM | POA: Diagnosis present

## 2023-05-02 DIAGNOSIS — M542 Cervicalgia: Secondary | ICD-10-CM | POA: Diagnosis present

## 2023-05-02 NOTE — Therapy (Signed)
 OUTPATIENT PHYSICAL THERAPY NOTE   Patient Name: Manuel [name] MRN: [id number] DOB:May 18, 1960, 63 y.o., male Today's Date: 05/02/2023   PT End of Session - 05/02/23 1213     Visit Number 10    Number of Visits 15    Date for PT Re-Evaluation 06/27/23    Authorization Type [facility]    PT Start Time 1215    PT Stop Time 1256    PT Time Calculation (min) 41 min    Activity Tolerance Patient tolerated treatment well    Behavior During Therapy WFL for tasks assessed/performed               Past Medical History:  Diagnosis Date   Arthritis    Asthma    CHF (congestive heart failure) (HCC)    Diabetes mellitus without complication (HCC)    Dyspnea    GERD (gastroesophageal reflux disease)    HLD (hyperlipidemia)    Hypertension    Pneumonia    1981   Seasonal allergies    Past Surgical History:  Procedure Laterality Date   CARDIOVASCULAR STRESS TEST  07/31/2020   Nuclear stress test 07/31/20 [facility]): Negative study for ischemia or scar at 96% maximal heart rate predicted by age.  Overall gated LV systolic function was normal, calculated EF 64% at stress and at rest.  No LV dilatation.   COLONOSCOPY     left arm surgery     POSTERIOR CERVICAL FUSION/FORAMINOTOMY N/A 07/16/2021   Procedure: Posterior cervical fusion with lateral mass fixation - Cervical Three-Cervical Seven,, cervical laminectomy  - Cervical Three-Cevical Six;  Surgeon: Tia Alert, MD;  Location: [facility] OR;  Service: Neurosurgery;  Laterality: N/A;   Patient Active Problem List   Diagnosis Date Noted   Wound infection after surgery 07/22/2021   S/P cervical spinal fusion 07/16/2021    PCP: Sherwood Gambler, MD  REFERRING PROVIDER: Arman Bogus, MD  THERAPY DIAG:  Cervicalgia - Plan: PT plan of care cert/re-cert  Other low back pain - Plan: PT plan of care cert/re-cert  Muscle weakness - Plan: PT plan of care cert/re-cert  Unsteadiness on feet - Plan: PT plan of care  cert/re-cert  REFERRING DIAG: Cervicalgia [M54.2]   Rationale for Evaluation and Treatment:  Rehabilitation  SUBJECTIVE:  PERTINENT PAST HISTORY:  C3 - C7 fusion and decompression (June 2023), CHF, DM      PRECAUTIONS: None  WEIGHT BEARING RESTRICTIONS No  FALLS:  Has patient fallen in last 6 months? No, Number of falls: 0  MOI/History of condition:  Onset date: Chronic with acute exacerbation over the last few months  SUBJECTIVE STATEMENT  05/02/2023: Pt repor.   Manuel [name] is a 63 y.o. male who presents to clinic with chief complaint of lower Cx spine, upper Tx spine pain.  He has had chronic neck pain for years resulting in decompression and fusion of C3-C7 in June of 2023.  He does endorse some N/t in the little finger of his L hand which is intermittent.  He was doing pulmonary rehab but d/c d/t back pain and it lowering his blood sugar.  He has concurrent chronic low back pain.    Red flags:  denies   Pain:  Are you having pain? Yes Pain location: lower Cx/upper Tx spine NPRS scale:  5/10 to 9/10 Aggravating factors: increased activity or sudden movements Relieving factors: rest, medication  Pain description: sharp and aching Stage: Chronic  Occupation: NA  Assistive Device: NA  Hand Dominance: ambidextrous  Patient Goals/Specific Activities: reduce pain   OBJECTIVE:   DIAGNOSTIC FINDINGS:   IMPRESSION: 1. Status post posterior fusion and decompression C3-C7, with no residual spinal canal stenosis at these levels. Moderate to severe bilateral neural foraminal narrowing at C6-C7, moderate bilateral neural foraminal narrowing at C4-C5 and C5-C6, and mild to moderate bilateral neural foraminal narrowing C3-C4, similar to prior. 2. C7-T1 severe right and moderate to severe left neural foraminal narrowing, which has likely progressed on the left. 3. C2-C3 mild bilateral neural foraminal narrowing, unchanged.  GENERAL  OBSERVATION: Significant rounded shoulders with forward head and slumped posture     SENSATION: Light touch: Deficits L C8 dermatome   PALPATION: Significant TTP bil UT, bil sub occipitals, bil LS, bil cervical paraspinals  Cervical ROM  ROM ROM  (Eval)  Flexion 10  Extension 4  Right lateral flexion 8  Left lateral flexion 10  Right rotation 10  Left rotation 12  Flexion rotation (normal is 30 degrees)   Flexion rotation (normal is 30 degrees)     (Blank rows = not tested, N = WNL, * = concordant pain)  UPPER EXTREMITY MMT:  MMT Right (Eval) Left (Eval)  Shoulder flexion 4 3+  Shoulder abduction (C5)    Shoulder ER 4 4  Shoulder IR 4 4  Middle trapezius    Lower trapezius    Shoulder extension    Grip strength    Shoulder shrug (C4)    Elbow flexion (C6)    Elbow ext (C7) c c  Thumb ext (C8) c c  Finger abd (T1) c c  Grossly     (Blank rows = not tested, score listed is out of 5 possible points.  N = WNL, D = diminished, C = clear for gross weakness with myotome testing, * = concordant pain with testing)   PATIENT SURVEYS:  Neck Disability Index: 26 / 50 = 52.0 %    TODAY'S TREATMENT:   [facility] Adult PT Treatment:                                                DATE: 05/02/2023   Therex: UBE 3 min ea fwd/back, level 1  Standing rows - 3x10 - 13# Standing pull downs - 13# - 3x10 Shoulder rolls between sets of standing band exercises Chin tuck into ball on wall - 2x10 - relieving - combined with Supine horizontal abduction, 2 x 10 GTB Bil shoulder ER with TB - 3x10 - GTB [place]/HM Discussion and recommendations for sock aid and long shoe horn to help don and doff shoes and socks   Manual therapy: Skilled palpation to identify trigger points for TDN STM to all listed muscles following TDN  Trigger Point Dry Needling  Subsequent Treatment: Instructions provided previously at initial dry needling treatment.   Patient Verbal Consent Given: Yes Education  Handout Provided: Previously Provided Muscles Treated: bil UT, bil sub occipitals, bil tx paraspinals ~T4 Electrical Stimulation Performed: No Treatment Response/Outcome: twitch      HOME EXERCISE PROGRAM: Access Code: [email or web address] URL: [email or web address] Date: 03/18/2023 Prepared by: Mauri Reading  Exercises - Seated Scapular Retraction  - 2 x daily - 7 x weekly - 2 sets - 10 reps - 5 sec hold - Seated Thoracic Lumbar Extension  - 2 x daily - 7 x weekly - 3 sets - 10 reps - Supine Shoulder  Horizontal Abduction with Resistance  - 1 x daily - 7 x weekly - 2 sets - 10 reps - Bilateral Shoulder Flexion Wall Slide with Towel  - 1 x daily - 7 x weekly - 2 sets - 10 reps - 3 sec hold  Treatment priorities   Eval 3/6       Thoracic mobility TDN       Manual/TDN Chin tuck relieving       Gentle periscapular/cervical strengthening General strengthening       Aquatic?                  ASSESSMENT:  CLINICAL IMPRESSION:    Mikyle tolerated session well with no adverse reaction.  Upon goal recheck pt has made progress on his pain goal, with similar pain levels but with less frequency.  His NDI is not changed.  We discussed strategies to improve his independence in donning and doffing shoes and socks today which is a struggle for him.  He continues to report pain reduction with TDN.  EVAL: Selvin is a 63 y.o. male who presents to clinic with signs and sxs consistent with neck and upper thoracic pain.  Pt with long standing history of neck pain with decompression and fusion C3 -> C7.  Recent MRI shows no issue s with past fusion but some neural foraminal stenosis in the lower cervical spine.  He does have some sensory disturbance @ L C8 dermatome which is consistent with imaging.  Overall he shows increased muscle tone and bulk throughout Cx spine, UT, and LS.  He has concurrent low back pain which was not evaluated today d/t time constraints.  Helen will benefit from skilled  therapy to address relevant deficits and improve comfort with completion of daily tasks.    OBJECTIVE IMPAIRMENTS: Pain, cervical ROM, UE strength  ACTIVITY LIMITATIONS: sitting, reading, driving  PERSONAL FACTORS: See medical history and pertinent history   REHAB POTENTIAL: Fair chronic pain; see past medical history  CLINICAL DECISION MAKING: Evolving/moderate complexity  EVALUATION COMPLEXITY: Moderate   GOALS:   SHORT TERM GOALS: Target date: 04/08/2023   Giancarlo will be >75% HEP compliant to improve carryover between sessions and facilitate independent management of condition  Evaluation: ongoing Goal status: MET   LONG TERM GOALS: Target date: 05/06/2023 extended to 06/27/2023    Domenic will self report >/= 50% decrease in pain from evaluation to improve function in daily tasks  Evaluation/Baseline: 9/10 max pain 4/11: 9/10 max, but less frequent Goal status: Ongoing   2.  Gianluca will show a >/= 20 pct improvement in their NDI score (MCID ~20% or 10/50 pts) as a proxy for functional improvement  Evaluation/Baseline: Neck Disability Index: 26 / 50 = 52.0 % 4/11: 52% Goal status: Ongoing   3.  Pantelis will report confidence in self management of condition at time of discharge with advanced HEP  Evaluation/Baseline: unable to self manage 4/11: ongoing Goal status: Ongoing    PLAN: PT FREQUENCY: 1-2x/week  PT DURATION: 8 weeks  PLANNED INTERVENTIONS:  97164- PT Re-evaluation, 97110-Therapeutic exercises, 97530- Therapeutic activity, 97112- Neuromuscular re-education, 97535- Self Care, 16109- Manual therapy, [id number]- Gait training, [id number]- Aquatic Therapy, [phone]- Electrical stimulation (manual), [id number]- Vasopneumatic device, [id number]- Traction (mechanical), [id number]- Ionotophoresis 4mg /ml Dexamethasone, Taping, Dry Needling, Joint manipulation, and Spinal manipulation.   Kimberlee Nearing Marisabel Macpherson PT 05/02/2023 1:17 PM

## 2023-05-16 ENCOUNTER — Ambulatory Visit: Payer: Self-pay | Admitting: Physical Therapy

## 2023-05-16 ENCOUNTER — Encounter: Payer: Self-pay | Admitting: Physical Therapy

## 2023-05-16 DIAGNOSIS — R2681 Unsteadiness on feet: Secondary | ICD-10-CM

## 2023-05-16 DIAGNOSIS — M542 Cervicalgia: Secondary | ICD-10-CM | POA: Diagnosis not present

## 2023-05-16 DIAGNOSIS — M5459 Other low back pain: Secondary | ICD-10-CM

## 2023-05-16 DIAGNOSIS — M6281 Muscle weakness (generalized): Secondary | ICD-10-CM

## 2023-05-16 NOTE — Therapy (Signed)
 OUTPATIENT PHYSICAL THERAPY NOTE   Patient Name: Manuel [name] MRN: [id number] DOB:[birth year], 63 y.o., male Today's Date: 05/16/2023   PT End of Session - 05/16/23 1232     Visit Number 11    Number of Visits 15    Date for PT Re-Evaluation 06/27/23    Authorization Type [facility]    PT Start Time 1232    PT Stop Time 1312    PT Time Calculation (min) 40 min    Activity Tolerance Patient tolerated treatment well    Behavior During Therapy WFL for tasks assessed/performed               Past Medical History:  Diagnosis Date   Arthritis    Asthma    CHF (congestive heart failure) (HCC)    Diabetes mellitus without complication (HCC)    Dyspnea    GERD (gastroesophageal reflux disease)    HLD (hyperlipidemia)    Hypertension    Pneumonia    1981   Seasonal allergies    Past Surgical History:  Procedure Laterality Date   CARDIOVASCULAR STRESS TEST  07/31/2020   Nuclear stress test 07/31/20 [facility]): Negative study for ischemia or scar at 96% maximal heart rate predicted by age.  Overall gated LV systolic function was normal, calculated EF 64% at stress and at rest.  No LV dilatation.   COLONOSCOPY     left arm surgery     POSTERIOR CERVICAL FUSION/FORAMINOTOMY N/A 07/16/2021   Procedure: Posterior cervical fusion with lateral mass fixation - Cervical Three-Cervical Seven,, cervical laminectomy  - Cervical Three-Cevical Six;  Surgeon: Isadora Mar, MD;  Location: [facility] OR;  Service: Neurosurgery;  Laterality: N/A;   Patient Active Problem List   Diagnosis Date Noted   Wound infection after surgery 07/22/2021   S/P cervical spinal fusion 07/16/2021    PCP: Tatiana Farrier, MD  REFERRING PROVIDER: Joaquin Mulberry, MD  THERAPY DIAG:  Cervicalgia  Other low back pain  Muscle weakness  Unsteadiness on feet  REFERRING DIAG: Cervicalgia [M54.2]   Rationale for Evaluation and Treatment:  Rehabilitation  SUBJECTIVE:  PERTINENT PAST HISTORY:  C3 - C7  fusion and decompression (June 2023), CHF, DM      PRECAUTIONS: None  WEIGHT BEARING RESTRICTIONS No  FALLS:  Has patient fallen in last 6 months? No, Number of falls: 0  MOI/History of condition:  Onset date: Chronic with acute exacerbation over the last few months  SUBJECTIVE STATEMENT  05/16/2023: Pt reports that he is using a massager at home which has been somewhat helpful.   Manuel [name] is a 63 y.o. male who presents to clinic with chief complaint of lower Cx spine, upper Tx spine pain.  He has had chronic neck pain for years resulting in decompression and fusion of C3-C7 in June of 2023.  He does endorse some N/t in the little finger of his L hand which is intermittent.  He was doing pulmonary rehab but d/c d/t back pain and it lowering his blood sugar.  He has concurrent chronic low back pain.    Red flags:  denies   Pain:  Are you having pain? Yes Pain location: lower Cx/upper Tx spine NPRS scale:  5/10 to 9/10 Aggravating factors: increased activity or sudden movements Relieving factors: rest, medication  Pain description: sharp and aching Stage: Chronic  Occupation: NA  Assistive Device: NA  Hand Dominance: ambidextrous   Patient Goals/Specific Activities: reduce pain   OBJECTIVE:   DIAGNOSTIC FINDINGS:  IMPRESSION: 1. Status post posterior fusion and decompression C3-C7, with no residual spinal canal stenosis at these levels. Moderate to severe bilateral neural foraminal narrowing at C6-C7, moderate bilateral neural foraminal narrowing at C4-C5 and C5-C6, and mild to moderate bilateral neural foraminal narrowing C3-C4, similar to prior. 2. C7-T1 severe right and moderate to severe left neural foraminal narrowing, which has likely progressed on the left. 3. C2-C3 mild bilateral neural foraminal narrowing, unchanged.  GENERAL OBSERVATION: Significant rounded shoulders with forward head and slumped posture     SENSATION: Light touch:  Deficits L C8 dermatome   PALPATION: Significant TTP bil UT, bil sub occipitals, bil LS, bil cervical paraspinals  Cervical ROM  ROM ROM  (Eval)  Flexion 10  Extension 4  Right lateral flexion 8  Left lateral flexion 10  Right rotation 10  Left rotation 12  Flexion rotation (normal is 30 degrees)   Flexion rotation (normal is 30 degrees)     (Blank rows = not tested, N = WNL, * = concordant pain)  UPPER EXTREMITY MMT:  MMT Right (Eval) Left (Eval)  Shoulder flexion 4 3+  Shoulder abduction (C5)    Shoulder ER 4 4  Shoulder IR 4 4  Middle trapezius    Lower trapezius    Shoulder extension    Grip strength    Shoulder shrug (C4)    Elbow flexion (C6)    Elbow ext (C7) c c  Thumb ext (C8) c c  Finger abd (T1) c c  Grossly     (Blank rows = not tested, score listed is out of 5 possible points.  N = WNL, D = diminished, C = clear for gross weakness with myotome testing, * = concordant pain with testing)   PATIENT SURVEYS:  Neck Disability Index: 26 / 50 = 52.0 %    TODAY'S TREATMENT:   [facility] Adult PT Treatment:                                                DATE: 05/16/2023   Therex: UBE 3 min ea fwd/back, level 2  Standing rows - 2x15 - unilateral row - 10# Standing pull downs - 7# - 2x15 - unilateral  Shoulder rolls between sets of standing band exercises Chin tuck into ball on wall - 2x10 - relieving - combined with Supine horizontal abduction, 2 x 10 GTB Bil shoulder ER with TB - 3x10 - GTB  Manual therapy: Skilled palpation to identify trigger points for TDN STM to all listed muscles following TDN  Trigger Point Dry Needling  Subsequent Treatment: Instructions provided previously at initial dry needling treatment.   Patient Verbal Consent Given: Yes Education Handout Provided: Previously Provided Muscles Treated: bil UT, bil sub occipitals, bil tx paraspinals ~T4 Electrical Stimulation Performed: No Treatment Response/Outcome:  twitch      HOME EXERCISE PROGRAM: Access Code: [email or web address] URL: [email or web address] Date: 03/18/2023 Prepared by: Arlester Bence  Exercises - Seated Scapular Retraction  - 2 x daily - 7 x weekly - 2 sets - 10 reps - 5 sec hold - Seated Thoracic Lumbar Extension  - 2 x daily - 7 x weekly - 3 sets - 10 reps - Supine Shoulder Horizontal Abduction with Resistance  - 1 x daily - 7 x weekly - 2 sets - 10 reps - Bilateral Shoulder Flexion Wall Slide with Towel  -  1 x daily - 7 x weekly - 2 sets - 10 reps - 3 sec hold  Treatment priorities   Eval 3/6       Thoracic mobility TDN       Manual/TDN Chin tuck relieving       Gentle periscapular/cervical strengthening General strengthening       Aquatic?                  ASSESSMENT:  CLINICAL IMPRESSION:    Chilton tolerated session well with no adverse reaction.  Continued with combination of periscapular strengthening and TDN.  Added unilateral row and shoulder ext to increase core activation to good effect.  PT reports significant reduction in pain following TDN.  EVAL: Zander is a 63 y.o. male who presents to clinic with signs and sxs consistent with neck and upper thoracic pain.  Pt with long standing history of neck pain with decompression and fusion C3 -> C7.  Recent MRI shows no issue s with past fusion but some neural foraminal stenosis in the lower cervical spine.  He does have some sensory disturbance @ L C8 dermatome which is consistent with imaging.  Overall he shows increased muscle tone and bulk throughout Cx spine, UT, and LS.  He has concurrent low back pain which was not evaluated today d/t time constraints.  Angell will benefit from skilled therapy to address relevant deficits and improve comfort with completion of daily tasks.    OBJECTIVE IMPAIRMENTS: Pain, cervical ROM, UE strength  ACTIVITY LIMITATIONS: sitting, reading, driving  PERSONAL FACTORS: See medical history and pertinent history   REHAB  POTENTIAL: Fair chronic pain; see past medical history  CLINICAL DECISION MAKING: Evolving/moderate complexity  EVALUATION COMPLEXITY: Moderate   GOALS:   SHORT TERM GOALS: Target date: 04/08/2023   Hayden will be >75% HEP compliant to improve carryover between sessions and facilitate independent management of condition  Evaluation: ongoing Goal status: MET   LONG TERM GOALS: Target date: 05/06/2023 extended to 06/27/2023    Georges will self report >/= 50% decrease in pain from evaluation to improve function in daily tasks  Evaluation/Baseline: 9/10 max pain 4/11: 9/10 max, but less frequent Goal status: Ongoing   2.  Crews will show a >/= 20 pct improvement in their NDI score (MCID ~20% or 10/50 pts) as a proxy for functional improvement  Evaluation/Baseline: Neck Disability Index: 26 / 50 = 52.0 % 4/11: 52% Goal status: Ongoing   3.  Jorrell will report confidence in self management of condition at time of discharge with advanced HEP  Evaluation/Baseline: unable to self manage 4/11: ongoing Goal status: Ongoing    PLAN: PT FREQUENCY: 1-2x/week  PT DURATION: 8 weeks  PLANNED INTERVENTIONS:  97164- PT Re-evaluation, 97110-Therapeutic exercises, 97530- Therapeutic activity, 97112- Neuromuscular re-education, 97535- Self Care, [id number]- Manual therapy, [id number]- Gait training, [id number]- Aquatic Therapy, [id number]- Electrical stimulation (manual), [id number]- Vasopneumatic device, [id number]- Traction (mechanical), [id number]- Ionotophoresis 4mg /ml Dexamethasone , Taping, Dry Needling, Joint manipulation, and Spinal manipulation.   Heinz Eckert E Levante Simones PT 05/16/2023 1:16 PM

## 2023-05-23 ENCOUNTER — Encounter: Payer: Self-pay | Admitting: Physical Therapy

## 2023-05-23 ENCOUNTER — Ambulatory Visit: Payer: Self-pay | Attending: Neurological Surgery | Admitting: Physical Therapy

## 2023-05-23 DIAGNOSIS — M542 Cervicalgia: Secondary | ICD-10-CM | POA: Insufficient documentation

## 2023-05-23 DIAGNOSIS — M5459 Other low back pain: Secondary | ICD-10-CM | POA: Insufficient documentation

## 2023-05-23 DIAGNOSIS — M6281 Muscle weakness (generalized): Secondary | ICD-10-CM | POA: Insufficient documentation

## 2023-05-23 DIAGNOSIS — R2681 Unsteadiness on feet: Secondary | ICD-10-CM | POA: Diagnosis present

## 2023-05-23 NOTE — Therapy (Unsigned)
 OUTPATIENT PHYSICAL THERAPY NOTE   Patient Name: Manuel [name] MRN: [id number] DOB:30-Apr-1960, 63 y.o., male Today's Date: 05/24/2023   PT End of Session - 05/23/23 1232     Visit Number 12    Number of Visits 15    Date for PT Re-Evaluation 06/27/23    Authorization Type [facility]    PT Start Time 1230    PT Stop Time 1310    PT Time Calculation (min) 40 min    Activity Tolerance Patient tolerated treatment well    Behavior During Therapy WFL for tasks assessed/performed               Past Medical History:  Diagnosis Date   Arthritis    Asthma    CHF (congestive heart failure) (HCC)    Diabetes mellitus without complication (HCC)    Dyspnea    GERD (gastroesophageal reflux disease)    HLD (hyperlipidemia)    Hypertension    Pneumonia    1981   Seasonal allergies    Past Surgical History:  Procedure Laterality Date   CARDIOVASCULAR STRESS TEST  07/31/2020   Nuclear stress test 07/31/20 [facility]): Negative study for ischemia or scar at 96% maximal heart rate predicted by age.  Overall gated LV systolic function was normal, calculated EF 64% at stress and at rest.  No LV dilatation.   COLONOSCOPY     left arm surgery     POSTERIOR CERVICAL FUSION/FORAMINOTOMY N/A 07/16/2021   Procedure: Posterior cervical fusion with lateral mass fixation - Cervical Three-Cervical Seven,, cervical laminectomy  - Cervical Three-Cevical Six;  Surgeon: Isadora Mar, MD;  Location: [facility] OR;  Service: Neurosurgery;  Laterality: N/A;   Patient Active Problem List   Diagnosis Date Noted   Wound infection after surgery 07/22/2021   S/P cervical spinal fusion 07/16/2021    PCP: Tatiana Farrier, MD  REFERRING PROVIDER: Joaquin Mulberry, MD  THERAPY DIAG:  Cervicalgia  Other low back pain  Muscle weakness  Unsteadiness on feet  REFERRING DIAG: Cervicalgia [M54.2]   Rationale for Evaluation and Treatment:  Rehabilitation  SUBJECTIVE:  PERTINENT PAST HISTORY:  C3 - C7  fusion and decompression (June 2023), CHF, DM      PRECAUTIONS: None  WEIGHT BEARING RESTRICTIONS No  FALLS:  Has patient fallen in last 6 months? No, Number of falls: 0  MOI/History of condition:  Onset date: Chronic with acute exacerbation over the last few months  SUBJECTIVE STATEMENT  05/24/2023: Pt reports he has been in a little more pain over the last several days with no clear cause.   Manuel [name] is a 63 y.o. male who presents to clinic with chief complaint of lower Cx spine, upper Tx spine pain.  He has had chronic neck pain for years resulting in decompression and fusion of C3-C7 in June of 2023.  He does endorse some N/t in the little finger of his L hand which is intermittent.  He was doing pulmonary rehab but d/c d/t back pain and it lowering his blood sugar.  He has concurrent chronic low back pain.    Red flags:  denies   Pain:  Are you having pain? Yes Pain location: lower Cx/upper Tx spine NPRS scale:  5/10 to 9/10 Aggravating factors: increased activity or sudden movements Relieving factors: rest, medication  Pain description: sharp and aching Stage: Chronic  Occupation: NA  Assistive Device: NA  Hand Dominance: ambidextrous   Patient Goals/Specific Activities: reduce pain   OBJECTIVE:  DIAGNOSTIC FINDINGS:   IMPRESSION: 1. Status post posterior fusion and decompression C3-C7, with no residual spinal canal stenosis at these levels. Moderate to severe bilateral neural foraminal narrowing at C6-C7, moderate bilateral neural foraminal narrowing at C4-C5 and C5-C6, and mild to moderate bilateral neural foraminal narrowing C3-C4, similar to prior. 2. C7-T1 severe right and moderate to severe left neural foraminal narrowing, which has likely progressed on the left. 3. C2-C3 mild bilateral neural foraminal narrowing, unchanged.  GENERAL OBSERVATION: Significant rounded shoulders with forward head and slumped  posture     SENSATION: Light touch: Deficits L C8 dermatome   PALPATION: Significant TTP bil UT, bil sub occipitals, bil LS, bil cervical paraspinals  Cervical ROM  ROM ROM  (Eval)  Flexion 10  Extension 4  Right lateral flexion 8  Left lateral flexion 10  Right rotation 10  Left rotation 12  Flexion rotation (normal is 30 degrees)   Flexion rotation (normal is 30 degrees)     (Blank rows = not tested, N = WNL, * = concordant pain)  UPPER EXTREMITY MMT:  MMT Right (Eval) Left (Eval)  Shoulder flexion 4 3+  Shoulder abduction (C5)    Shoulder ER 4 4  Shoulder IR 4 4  Middle trapezius    Lower trapezius    Shoulder extension    Grip strength    Shoulder shrug (C4)    Elbow flexion (C6)    Elbow ext (C7) c c  Thumb ext (C8) c c  Finger abd (T1) c c  Grossly     (Blank rows = not tested, score listed is out of 5 possible points.  N = WNL, D = diminished, C = clear for gross weakness with myotome testing, * = concordant pain with testing)   PATIENT SURVEYS:  Neck Disability Index: 26 / 50 = 52.0 %    TODAY'S TREATMENT:   [facility] Adult PT Treatment:                                                DATE: 05/24/2023   Therex: UBE 3 min ea fwd/back, level 2  Standing rows - 2x10 ea - unilateral row - 10# Standing pull downs - 7# - 2x10 ea - unilateral  Paloff press - 2x10 - 10# Shoulder rolls between sets of standing band exercises Scaption with back at wall to 90 - 2x10 Chin tuck into ball on wall - 2x10 - relieving - combined with Supine horizontal abduction, 2 x 10 Blue TB Bil shoulder ER with TB - 3x10 - Blue TB  Manual therapy: Skilled palpation to identify trigger points for TDN STM to all listed muscles following TDN  Trigger Point Dry Needling  Subsequent Treatment: Instructions provided previously at initial dry needling treatment.   Patient Verbal Consent Given: Yes Education Handout Provided: Previously Provided Muscles Treated: bil UT, bil sub  occipitals, bil tx paraspinals ~T4 Electrical Stimulation Performed: No Treatment Response/Outcome: twitch      HOME EXERCISE PROGRAM: Access Code: [email or web address] URL: [email or web address] Date: 03/18/2023 Prepared by: Arlester Bence  Exercises - Seated Scapular Retraction  - 2 x daily - 7 x weekly - 2 sets - 10 reps - 5 sec hold - Seated Thoracic Lumbar Extension  - 2 x daily - 7 x weekly - 3 sets - 10 reps - Supine Shoulder Horizontal Abduction with Resistance  -  1 x daily - 7 x weekly - 2 sets - 10 reps - Bilateral Shoulder Flexion Wall Slide with Towel  - 1 x daily - 7 x weekly - 2 sets - 10 reps - 3 sec hold  Treatment priorities   Eval 3/6       Thoracic mobility TDN       Manual/TDN Chin tuck relieving       Gentle periscapular/cervical strengthening General strengthening       Aquatic?                  ASSESSMENT:  CLINICAL IMPRESSION:    Manuel [name] tolerated session well with no adverse reaction.  We were able to add in paloff press for core and shoulder activation as well as scaption to 90 degrees.  Limited scaption ROM for pt comfort.  Pt continues to report significant benefit from TDN.  EVAL: Manuel [name] is a 63 y.o. male who presents to clinic with signs and sxs consistent with neck and upper thoracic pain.  Pt with long standing history of neck pain with decompression and fusion C3 -> C7.  Recent MRI shows no issue s with past fusion but some neural foraminal stenosis in the lower cervical spine.  He does have some sensory disturbance @ L C8 dermatome which is consistent with imaging.  Overall he shows increased muscle tone and bulk throughout Cx spine, UT, and LS.  He has concurrent low back pain which was not evaluated today d/t time constraints.  Manuel [name] will benefit from skilled therapy to address relevant deficits and improve comfort with completion of daily tasks.    OBJECTIVE IMPAIRMENTS: Pain, cervical ROM, UE strength  ACTIVITY LIMITATIONS: sitting,  reading, driving  PERSONAL FACTORS: See medical history and pertinent history   REHAB POTENTIAL: Fair chronic pain; see past medical history  CLINICAL DECISION MAKING: Evolving/moderate complexity  EVALUATION COMPLEXITY: Moderate   GOALS:   SHORT TERM GOALS: Target date: 04/08/2023   Manuel [name] will be >75% HEP compliant to improve carryover between sessions and facilitate independent management of condition  Evaluation: ongoing Goal status: MET   LONG TERM GOALS: Target date: 05/06/2023 extended to 06/27/2023    Manuel [name] will self report >/= 50% decrease in pain from evaluation to improve function in daily tasks  Evaluation/Baseline: 9/10 max pain 4/11: 9/10 max, but less frequent Goal status: Ongoing   2.  Manuel [name] will show a >/= 20 pct improvement in their NDI score (MCID ~20% or 10/50 pts) as a proxy for functional improvement  Evaluation/Baseline: Neck Disability Index: 26 / 50 = 52.0 % 4/11: 52% Goal status: Ongoing   3.  Manuel [name] will report confidence in self management of condition at time of discharge with advanced HEP  Evaluation/Baseline: unable to self manage 4/11: ongoing Goal status: Ongoing    PLAN: PT FREQUENCY: 1-2x/week  PT DURATION: 8 weeks  PLANNED INTERVENTIONS:  97164- PT Re-evaluation, 97110-Therapeutic exercises, 97530- Therapeutic activity, 97112- Neuromuscular re-education, 97535- Self Care, 29562- Manual therapy, [id number]- Gait training, [id number]- Aquatic Therapy, [id number]- Electrical stimulation (manual), [id number]- Vasopneumatic device, [id number]- Traction (mechanical), [id number]- Ionotophoresis 4mg /ml Dexamethasone , Taping, Dry Needling, Joint manipulation, and Spinal manipulation.   Andilyn Bettcher E Karen Huhta PT 05/24/2023 8:10 AM

## 2023-05-30 ENCOUNTER — Encounter: Payer: Self-pay | Admitting: Physical Therapy

## 2023-05-30 ENCOUNTER — Ambulatory Visit: Payer: Self-pay | Admitting: Physical Therapy

## 2023-05-30 DIAGNOSIS — M5459 Other low back pain: Secondary | ICD-10-CM

## 2023-05-30 DIAGNOSIS — M542 Cervicalgia: Secondary | ICD-10-CM | POA: Diagnosis not present

## 2023-05-30 DIAGNOSIS — M6281 Muscle weakness (generalized): Secondary | ICD-10-CM

## 2023-05-30 NOTE — Therapy (Signed)
 OUTPATIENT PHYSICAL THERAPY NOTE   Patient Name: Manuel [name] MRN: [id number] DOB:[birth year], 62 y.o., male Today's Date: 05/30/2023   Manuel [name] End of Session - 05/30/23 1235     Visit Number 13    Number of Visits 15    Date for Manuel [name] Re-Evaluation 06/27/23    Authorization Type [place]    Manuel [name] Start Time 1230    Manuel [name] Stop Time 1311    Manuel [name] Time Calculation (min) 41 min    Activity Tolerance Patient tolerated treatment well    Behavior During Therapy WFL for tasks assessed/performed               Past Medical History:  Diagnosis Date   Arthritis    Asthma    CHF (congestive heart failure) (HCC)    Diabetes mellitus without complication (HCC)    Dyspnea    GERD (gastroesophageal reflux disease)    HLD (hyperlipidemia)    Hypertension    Pneumonia    1981   Seasonal allergies    Past Surgical History:  Procedure Laterality Date   CARDIOVASCULAR STRESS TEST  07/31/2020   Nuclear stress test 07/31/20 [facility]): Negative study for ischemia or scar at 96% maximal heart rate predicted by age.  Overall gated LV systolic function was normal, calculated EF 64% at stress and at rest.  No LV dilatation.   COLONOSCOPY     left arm surgery     POSTERIOR CERVICAL FUSION/FORAMINOTOMY N/A 07/16/2021   Procedure: Posterior cervical fusion with lateral mass fixation - Cervical Three-Cervical Seven,, cervical laminectomy  - Cervical Three-Cevical Six;  Surgeon: Isadora Mar, MD;  Location: [facility] OR;  Service: Neurosurgery;  Laterality: N/A;   Patient Active Problem List   Diagnosis Date Noted   Wound infection after surgery 07/22/2021   S/P cervical spinal fusion 07/16/2021    PCP: Tatiana Farrier, MD  REFERRING PROVIDER: Joaquin Mulberry, MD  THERAPY DIAG:  Cervicalgia  Other low back pain  Muscle weakness  REFERRING DIAG: Cervicalgia [M54.2]   Rationale for Evaluation and Treatment:  Rehabilitation  SUBJECTIVE:  PERTINENT PAST HISTORY:  C3 - C7 fusion and decompression  (June 2023), CHF, DM      PRECAUTIONS: None  WEIGHT BEARING RESTRICTIONS No  FALLS:  Has patient fallen in last 6 months? No, Number of falls: 0  MOI/History of condition:  Onset date: Chronic with acute exacerbation over the last few months  SUBJECTIVE STATEMENT  05/30/2023: Manuel [name] reports his pain has been lower this week.   Manuel [name] is a 63 y.o. male who presents to clinic with chief complaint of lower Cx spine, upper Tx spine pain.  Manuel [name] has had chronic neck pain for years resulting in decompression and fusion of C3-C7 in June of 2023.  Manuel [name] does endorse some N/t in the little finger of his L hand which is intermittent.  Manuel [name] was doing pulmonary rehab but d/c d/t back pain and it lowering his blood sugar.  Manuel [name] has concurrent chronic low back pain.    Red flags:  denies   Pain:  Are you having pain? Yes Pain location: lower Cx/upper Tx spine NPRS scale:  5/10 to 9/10 Aggravating factors: increased activity or sudden movements Relieving factors: rest, medication  Pain description: sharp and aching Stage: Chronic  Occupation: NA  Assistive Device: NA  Hand Dominance: ambidextrous   Patient Goals/Specific Activities: reduce pain   OBJECTIVE:   DIAGNOSTIC FINDINGS:   IMPRESSION: 1. Status post posterior fusion and decompression C3-C7,  with no residual spinal canal stenosis at these levels. Moderate to severe bilateral neural foraminal narrowing at C6-C7, moderate bilateral neural foraminal narrowing at C4-C5 and C5-C6, and mild to moderate bilateral neural foraminal narrowing C3-C4, similar to prior. 2. C7-T1 severe right and moderate to severe left neural foraminal narrowing, which has likely progressed on the left. 3. C2-C3 mild bilateral neural foraminal narrowing, unchanged.  GENERAL OBSERVATION: Significant rounded shoulders with forward head and slumped posture     SENSATION: Light touch: Deficits L C8 dermatome   PALPATION: Significant TTP bil  UT, bil sub occipitals, bil LS, bil cervical paraspinals  Cervical ROM  ROM ROM  (Eval)  Flexion 10  Extension 4  Right lateral flexion 8  Left lateral flexion 10  Right rotation 10  Left rotation 12  Flexion rotation (normal is 30 degrees)   Flexion rotation (normal is 30 degrees)     (Blank rows = not tested, N = WNL, * = concordant pain)  UPPER EXTREMITY MMT:  MMT Right (Eval) Left (Eval)  Shoulder flexion 4 3+  Shoulder abduction (C5)    Shoulder ER 4 4  Shoulder IR 4 4  Middle trapezius    Lower trapezius    Shoulder extension    Grip strength    Shoulder shrug (C4)    Elbow flexion (C6)    Elbow ext (C7) c c  Thumb ext (C8) c c  Finger abd (T1) c c  Grossly     (Blank rows = not tested, score listed is out of 5 possible points.  N = WNL, D = diminished, C = clear for gross weakness with myotome testing, * = concordant pain with testing)   PATIENT SURVEYS:  Neck Disability Index: 26 / 50 = 52.0 %    TODAY'S TREATMENT:   [facility] Adult Manuel [name] Treatment:                                                DATE: 05/30/2023   Therex: UBE 3 min ea fwd/back, level 2  Standing rows - 2x10 ea - unilateral row - 10# Standing pull downs - 7# - 2x10 ea - unilateral  Standing W w/ YTB - 2x10 Paloff press - 2x10 - 10# Shoulder rolls between sets of standing band exercises Scaption with back at wall to 90 - 2x10 Chin tuck into ball on wall - 2x10 - relieving - combined with Supine horizontal abduction, 2 x 10 Blue TB Bil shoulder ER with TB - 3x10 - Blue TB  Manual therapy: Skilled palpation to identify trigger points for TDN STM to all listed muscles following TDN  Trigger Point Dry Needling  Subsequent Treatment: Instructions provided previously at initial dry needling treatment.   Patient Verbal Consent Given: Yes Education Handout Provided: Previously Provided Muscles Treated: bil UT, bil sub occipitals, bil tx paraspinals ~T4 Electrical Stimulation Performed:  No Treatment Response/Outcome: twitch      HOME EXERCISE PROGRAM: Access Code: [email or web address] URL: [email or web address] Date: 03/18/2023 Prepared by: Arlester Bence  Exercises - Seated Scapular Retraction  - 2 x daily - 7 x weekly - 2 sets - 10 reps - 5 sec hold - Seated Thoracic Lumbar Extension  - 2 x daily - 7 x weekly - 3 sets - 10 reps - Supine Shoulder Horizontal Abduction with Resistance  - 1 x daily - 7 x  weekly - 2 sets - 10 reps - Bilateral Shoulder Flexion Wall Slide with Towel  - 1 x daily - 7 x weekly - 2 sets - 10 reps - 3 sec hold  Treatment priorities   Eval 3/6       Thoracic mobility TDN       Manual/TDN Chin tuck relieving       Gentle periscapular/cervical strengthening General strengthening       Aquatic?                  ASSESSMENT:  CLINICAL IMPRESSION:    Manuel [name] tolerated session well with no adverse reaction.  Continued to add UE periscapular exercises below shoulder height which is well tolerated.  Manuel [name] continues to report significant pain reduction with TDN.  Manuel [name] reports fatigue but no increase in pain with exercises completed today.  EVAL: Manuel [name] is a 63 y.o. male who presents to clinic with signs and sxs consistent with neck and upper thoracic pain.  Manuel [name] with long standing history of neck pain with decompression and fusion C3 -> C7.  Recent MRI shows no issue s with past fusion but some neural foraminal stenosis in the lower cervical spine.  Manuel [name] does have some sensory disturbance @ L C8 dermatome which is consistent with imaging.  Overall Manuel [name] shows increased muscle tone and bulk throughout Cx spine, UT, and LS.  Manuel [name] has concurrent low back pain which was not evaluated today d/t time constraints.  Manuel [name] will benefit from skilled therapy to address relevant deficits and improve comfort with completion of daily tasks.    OBJECTIVE IMPAIRMENTS: Pain, cervical ROM, UE strength  ACTIVITY LIMITATIONS: sitting, reading, driving  PERSONAL FACTORS: See  medical history and pertinent history   REHAB POTENTIAL: Fair chronic pain; see past medical history  CLINICAL DECISION MAKING: Evolving/moderate complexity  EVALUATION COMPLEXITY: Moderate   GOALS:   SHORT TERM GOALS: Target date: 04/08/2023   Ayedin will be >75% HEP compliant to improve carryover between sessions and facilitate independent management of condition  Evaluation: ongoing Goal status: MET   LONG TERM GOALS: Target date: 05/06/2023 extended to 06/27/2023    Cormick will self report >/= 50% decrease in pain from evaluation to improve function in daily tasks  Evaluation/Baseline: 9/10 max pain 4/11: 9/10 max, but less frequent Goal status: Ongoing   2.  Lanz will show a >/= 20 pct improvement in their NDI score (MCID ~20% or 10/50 pts) as a proxy for functional improvement  Evaluation/Baseline: Neck Disability Index: 26 / 50 = 52.0 % 4/11: 52% Goal status: Ongoing   3.  Adrew will report confidence in self management of condition at time of discharge with advanced HEP  Evaluation/Baseline: unable to self manage 4/11: ongoing Goal status: Ongoing    PLAN: Manuel [name] FREQUENCY: 1-2x/week  Manuel [name] DURATION: 8 weeks  PLANNED INTERVENTIONS:  97164- Manuel [name] Re-evaluation, 97110-Therapeutic exercises, 97530- Therapeutic activity, 97112- Neuromuscular re-education, 97535- Self Care, 13086- Manual therapy, [id number]- Gait training, [id number]- Aquatic Therapy, [id number]- Electrical stimulation (manual), [id number]- Vasopneumatic device, [id number]- Traction (mechanical), [id number]- Ionotophoresis 4mg /ml Dexamethasone , Taping, Dry Needling, Joint manipulation, and Spinal manipulation.   Julieanna Geraci E Parul Porcelli Manuel [name] 05/30/2023 1:15 PM

## 2023-06-06 ENCOUNTER — Encounter: Payer: Self-pay | Admitting: Physical Therapy

## 2023-06-06 ENCOUNTER — Ambulatory Visit: Admitting: Physical Therapy

## 2023-06-06 DIAGNOSIS — M542 Cervicalgia: Secondary | ICD-10-CM

## 2023-06-06 DIAGNOSIS — M6281 Muscle weakness (generalized): Secondary | ICD-10-CM

## 2023-06-06 DIAGNOSIS — M5459 Other low back pain: Secondary | ICD-10-CM

## 2023-06-06 DIAGNOSIS — R2681 Unsteadiness on feet: Secondary | ICD-10-CM

## 2023-06-06 NOTE — Therapy (Signed)
 OUTPATIENT PHYSICAL THERAPY NOTE   Patient Name: Manuel [name] MRN: [id number] DOB:January 28, 1960, 63 y.o., male Today's Date: 06/06/2023   PT End of Session - 06/06/23 1400     Visit Number 14    Number of Visits 15    Date for PT Re-Evaluation 06/27/23    Authorization Type [place]    PT Start Time 1400    PT Stop Time 1441    PT Time Calculation (min) 41 min    Activity Tolerance Patient tolerated treatment well    Behavior During Therapy WFL for tasks assessed/performed               Past Medical History:  Diagnosis Date   Arthritis    Asthma    CHF (congestive heart failure) (HCC)    Diabetes mellitus without complication (HCC)    Dyspnea    GERD (gastroesophageal reflux disease)    HLD (hyperlipidemia)    Hypertension    Pneumonia    1981   Seasonal allergies    Past Surgical History:  Procedure Laterality Date   CARDIOVASCULAR STRESS TEST  07/31/2020   Nuclear stress test 07/31/20 [facility]): Negative study for ischemia or scar at 96% maximal heart rate predicted by age.  Overall gated LV systolic function was normal, calculated EF 64% at stress and at rest.  No LV dilatation.   COLONOSCOPY     left arm surgery     POSTERIOR CERVICAL FUSION/FORAMINOTOMY N/A 07/16/2021   Procedure: Posterior cervical fusion with lateral mass fixation - Cervical Three-Cervical Seven,, cervical laminectomy  - Cervical Three-Cevical Six;  Surgeon: Isadora Mar, MD;  Location: [facility] OR;  Service: Neurosurgery;  Laterality: N/A;   Patient Active Problem List   Diagnosis Date Noted   Wound infection after surgery 07/22/2021   S/P cervical spinal fusion 07/16/2021    PCP: Tatiana Farrier, MD  REFERRING PROVIDER: Joaquin Mulberry, MD  THERAPY DIAG:  Cervicalgia  Other low back pain  Muscle weakness  Unsteadiness on feet  REFERRING DIAG: Cervicalgia [M54.2]   Rationale for Evaluation and Treatment:  Rehabilitation  SUBJECTIVE:  PERTINENT PAST HISTORY:  C3 - C7  fusion and decompression (June 2023), CHF, DM      PRECAUTIONS: None  WEIGHT BEARING RESTRICTIONS No  FALLS:  Has patient fallen in last 6 months? No, Number of falls: 0  MOI/History of condition:  Onset date: Chronic with acute exacerbation over the last few months  SUBJECTIVE STATEMENT  06/06/2023: Pt reports that he was using a neck massager and feels he was too aggressive and has been having more pain recently.   Manuel [name] is a 63 y.o. male who presents to clinic with chief complaint of lower Cx spine, upper Tx spine pain.  He has had chronic neck pain for years resulting in decompression and fusion of C3-C7 in June of 2023.  He does endorse some N/t in the little finger of his L hand which is intermittent.  He was doing pulmonary rehab but d/c d/t back pain and it lowering his blood sugar.  He has concurrent chronic low back pain.    Red flags:  denies   Pain:  Are you having pain? Yes Pain location: lower Cx/upper Tx spine NPRS scale:  5/10 to 9/10 Aggravating factors: increased activity or sudden movements Relieving factors: rest, medication  Pain description: sharp and aching Stage: Chronic  Occupation: NA  Assistive Device: NA  Hand Dominance: ambidextrous   Patient Goals/Specific Activities: reduce pain  OBJECTIVE:   DIAGNOSTIC FINDINGS:   IMPRESSION: 1. Status post posterior fusion and decompression C3-C7, with no residual spinal canal stenosis at these levels. Moderate to severe bilateral neural foraminal narrowing at C6-C7, moderate bilateral neural foraminal narrowing at C4-C5 and C5-C6, and mild to moderate bilateral neural foraminal narrowing C3-C4, similar to prior. 2. C7-T1 severe right and moderate to severe left neural foraminal narrowing, which has likely progressed on the left. 3. C2-C3 mild bilateral neural foraminal narrowing, unchanged.  GENERAL OBSERVATION: Significant rounded shoulders with forward head and slumped  posture     SENSATION: Light touch: Deficits L C8 dermatome   PALPATION: Significant TTP bil UT, bil sub occipitals, bil LS, bil cervical paraspinals  Cervical ROM  ROM ROM  (Eval)  Flexion 10  Extension 4  Right lateral flexion 8  Left lateral flexion 10  Right rotation 10  Left rotation 12  Flexion rotation (normal is 30 degrees)   Flexion rotation (normal is 30 degrees)     (Blank rows = not tested, N = WNL, * = concordant pain)  UPPER EXTREMITY MMT:  MMT Right (Eval) Left (Eval)  Shoulder flexion 4 3+  Shoulder abduction (C5)    Shoulder ER 4 4  Shoulder IR 4 4  Middle trapezius    Lower trapezius    Shoulder extension    Grip strength    Shoulder shrug (C4)    Elbow flexion (C6)    Elbow ext (C7) c c  Thumb ext (C8) c c  Finger abd (T1) c c  Grossly     (Blank rows = not tested, score listed is out of 5 possible points.  N = WNL, D = diminished, C = clear for gross weakness with myotome testing, * = concordant pain with testing)   PATIENT SURVEYS:  Neck Disability Index: 26 / 50 = 52.0 %    TODAY'S TREATMENT:   [facility] Adult PT Treatment:                                                DATE: 06/06/2023   Therex: UBE 3 min ea fwd/back, level 2  Standing rows - 2x10 ea - unilateral row - 10# Standing pull downs - 7# - 2x10 ea - unilateral  Standing W w/ YTB - 2x10 Paloff press - 2x10 - 10# Shoulder rolls between sets of standing band exercises Scaption with back at wall to 90 - 2x10 - 2# Chin tuck into ball on wall - 2x10 - relieving - combined with Supine horizontal abduction, 3 x 10 Blue TB Bil shoulder ER with TB - 3x10 - Blue TB  Manual therapy: Skilled palpation to identify trigger points for TDN STM to all listed muscles following TDN  Trigger Point Dry Needling  Subsequent Treatment: Instructions provided previously at initial dry needling treatment.   Patient Verbal Consent Given: Yes Education Handout Provided: Previously  Provided Muscles Treated: bil UT, bil sub occipitals, bil tx paraspinals ~T4 Electrical Stimulation Performed: No Treatment Response/Outcome: twitch      HOME EXERCISE PROGRAM: Access Code: [email or web address] URL: [email or web address] Date: 03/18/2023 Prepared by: Arlester Bence  Exercises - Seated Scapular Retraction  - 2 x daily - 7 x weekly - 2 sets - 10 reps - 5 sec hold - Seated Thoracic Lumbar Extension  - 2 x daily - 7 x weekly - 3 sets -  10 reps - Supine Shoulder Horizontal Abduction with Resistance  - 1 x daily - 7 x weekly - 2 sets - 10 reps - Bilateral Shoulder Flexion Wall Slide with Towel  - 1 x daily - 7 x weekly - 2 sets - 10 reps - 3 sec hold  Treatment priorities   Eval 3/6       Thoracic mobility TDN       Manual/TDN Chin tuck relieving       Gentle periscapular/cervical strengthening General strengthening       Aquatic?                  ASSESSMENT:  CLINICAL IMPRESSION:    Manuel [name] tolerated session well with no adverse reaction.  Continue with therex mainly concentrating on periscapular strengthening with integration of chin tuck.  Progressing exercise volume as expected.  Continues to respond well to TDN.  Plan on D/C next visit.  EVAL: Manuel [name] is a 63 y.o. male who presents to clinic with signs and sxs consistent with neck and upper thoracic pain.  Pt with long standing history of neck pain with decompression and fusion C3 -> C7.  Recent MRI shows no issue s with past fusion but some neural foraminal stenosis in the lower cervical spine.  He does have some sensory disturbance @ L C8 dermatome which is consistent with imaging.  Overall he shows increased muscle tone and bulk throughout Cx spine, UT, and LS.  He has concurrent low back pain which was not evaluated today d/t time constraints.  Manuel [name] will benefit from skilled therapy to address relevant deficits and improve comfort with completion of daily tasks.    OBJECTIVE IMPAIRMENTS: Pain, cervical  ROM, UE strength  ACTIVITY LIMITATIONS: sitting, reading, driving  PERSONAL FACTORS: See medical history and pertinent history   REHAB POTENTIAL: Fair chronic pain; see past medical history  CLINICAL DECISION MAKING: Evolving/moderate complexity  EVALUATION COMPLEXITY: Moderate   GOALS:   SHORT TERM GOALS: Target date: 04/08/2023   Manuel [name] will be >75% HEP compliant to improve carryover between sessions and facilitate independent management of condition  Evaluation: ongoing Goal status: MET   LONG TERM GOALS: Target date: 05/06/2023 extended to 06/27/2023    Manuel [name] will self report >/= 50% decrease in pain from evaluation to improve function in daily tasks  Evaluation/Baseline: 9/10 max pain 4/11: 9/10 max, but less frequent Goal status: Ongoing   2.  Manuel [name] will show a >/= 20 pct improvement in their NDI score (MCID ~20% or 10/50 pts) as a proxy for functional improvement  Evaluation/Baseline: Neck Disability Index: 26 / 50 = 52.0 % 4/11: 52% Goal status: Ongoing   3.  Manuel [name] will report confidence in self management of condition at time of discharge with advanced HEP  Evaluation/Baseline: unable to self manage 4/11: ongoing Goal status: Ongoing    PLAN: PT FREQUENCY: 1-2x/week  PT DURATION: 8 weeks  PLANNED INTERVENTIONS:  97164- PT Re-evaluation, 97110-Therapeutic exercises, 97530- Therapeutic activity, 97112- Neuromuscular re-education, 97535- Self Care, 16109- Manual therapy, [id number]- Gait training, [id number]- Aquatic Therapy, [phone]- Electrical stimulation (manual), [id number]- Vasopneumatic device, [id number]- Traction (mechanical), [id number]- Ionotophoresis 4mg /ml Dexamethasone , Taping, Dry Needling, Joint manipulation, and Spinal manipulation.   Manuel [name] E Peighton Edgin PT 06/06/2023 2:41 PM

## 2023-06-13 ENCOUNTER — Ambulatory Visit: Admitting: Physical Therapy

## 2023-06-13 ENCOUNTER — Encounter: Payer: Self-pay | Admitting: Physical Therapy

## 2023-06-13 DIAGNOSIS — M542 Cervicalgia: Secondary | ICD-10-CM | POA: Diagnosis not present

## 2023-06-13 DIAGNOSIS — R2681 Unsteadiness on feet: Secondary | ICD-10-CM

## 2023-06-13 DIAGNOSIS — M5459 Other low back pain: Secondary | ICD-10-CM

## 2023-06-13 DIAGNOSIS — M6281 Muscle weakness (generalized): Secondary | ICD-10-CM

## 2023-06-13 NOTE — Therapy (Signed)
 PHYSICAL THERAPY DISCHARGE SUMMARY  Visits from Start of Care: 15  Current functional level related to goals / functional outcomes: See assessment/goals   Remaining deficits: See assessment/goals   Education / Equipment: HEP and D/C plans  Patient agrees to discharge. Patient goals were partially met. Patient is being discharged due to feeling confident with self management.   Patient Name: Collen Vincent MRN: [id number] DOB:02-15-1960, 63 y.o., male Today's Date: 06/13/2023   PT End of Session - 06/13/23 1258     Visit Number 15    Number of Visits 15    Date for PT Re-Evaluation 06/27/23    Authorization Type [facility]    PT Start Time 1300    PT Stop Time 1341    PT Time Calculation (min) 41 min    Activity Tolerance Patient tolerated treatment well    Behavior During Therapy WFL for tasks assessed/performed               Past Medical History:  Diagnosis Date   Arthritis    Asthma    CHF (congestive heart failure) (HCC)    Diabetes mellitus without complication (HCC)    Dyspnea    GERD (gastroesophageal reflux disease)    HLD (hyperlipidemia)    Hypertension    Pneumonia    1981   Seasonal allergies    Past Surgical History:  Procedure Laterality Date   CARDIOVASCULAR STRESS TEST  07/31/2020   Nuclear stress test 07/31/20 [facility]): Negative study for ischemia or scar at 96% maximal heart rate predicted by age.  Overall gated LV systolic function was normal, calculated EF 64% at stress and at rest.  No LV dilatation.   COLONOSCOPY     left arm surgery     POSTERIOR CERVICAL FUSION/FORAMINOTOMY N/A 07/16/2021   Procedure: Posterior cervical fusion with lateral mass fixation - Cervical Three-Cervical Seven,, cervical laminectomy  - Cervical Three-Cevical Six;  Surgeon: Isadora Mar, MD;  Location: [facility] OR;  Service: Neurosurgery;  Laterality: N/A;   Patient Active Problem List   Diagnosis Date Noted   Wound infection after surgery 07/22/2021   S/P  cervical spinal fusion 07/16/2021    PCP: Tatiana Farrier, MD  REFERRING PROVIDER: Tatiana Farrier, MD  THERAPY DIAG:  Cervicalgia  Other low back pain  Muscle weakness  Unsteadiness on feet  REFERRING DIAG: Cervicalgia [M54.2]   Rationale for Evaluation and Treatment:  Rehabilitation  SUBJECTIVE:  PERTINENT PAST HISTORY:  C3 - C7 fusion and decompression (June 2023), CHF, DM      PRECAUTIONS: None  WEIGHT BEARING RESTRICTIONS No  FALLS:  Has patient fallen in last 6 months? No, Number of falls: 0  MOI/History of condition:  Onset date: Chronic with acute exacerbation over the last few months  SUBJECTIVE STATEMENT  06/13/2023: Pt reports that he was using a neck massager and feels he was too aggressive and has been having more pain recently.   Raif Chachere is a 63 y.o. male who presents to clinic with chief complaint of lower Cx spine, upper Tx spine pain.  He has had chronic neck pain for years resulting in decompression and fusion of C3-C7 in June of 2023.  He does endorse some N/t in the little finger of his L hand which is intermittent.  He was doing pulmonary rehab but d/c d/t back pain and it lowering his blood sugar.  He has concurrent chronic low back pain.    Red flags:  denies   Pain:  Are you having pain? Yes Pain location: lower Cx/upper Tx spine NPRS scale:  5/10 to 9/10 Aggravating factors: increased activity or sudden movements Relieving factors: rest, medication  Pain description: sharp and aching Stage: Chronic  Occupation: NA  Assistive Device: NA  Hand Dominance: ambidextrous   Patient Goals/Specific Activities: reduce pain   OBJECTIVE:   DIAGNOSTIC FINDINGS:   IMPRESSION: 1. Status post posterior fusion and decompression C3-C7, with no residual spinal canal stenosis at these levels. Moderate to severe bilateral neural foraminal narrowing at C6-C7, moderate bilateral neural foraminal narrowing at C4-C5 and  C5-C6, and mild to moderate bilateral neural foraminal narrowing C3-C4, similar to prior. 2. C7-T1 severe right and moderate to severe left neural foraminal narrowing, which has likely progressed on the left. 3. C2-C3 mild bilateral neural foraminal narrowing, unchanged.  GENERAL OBSERVATION: Significant rounded shoulders with forward head and slumped posture     SENSATION: Light touch: Deficits L C8 dermatome   PALPATION: Significant TTP bil UT, bil sub occipitals, bil LS, bil cervical paraspinals  Cervical ROM  ROM ROM  (Eval) 5/23  Flexion 10   Extension 4   Right lateral flexion 8   Left lateral flexion 10 35  Right rotation 10 40  Left rotation 12   Flexion rotation (normal is 30 degrees)    Flexion rotation (normal is 30 degrees)      (Blank rows = not tested, N = WNL, * = concordant pain)  UPPER EXTREMITY MMT:  MMT Right (Eval) Left (Eval)  Shoulder flexion 4 3+  Shoulder abduction (C5)    Shoulder ER 4 4  Shoulder IR 4 4  Middle trapezius    Lower trapezius    Shoulder extension    Grip strength    Shoulder shrug (C4)    Elbow flexion (C6)    Elbow ext (C7) c c  Thumb ext (C8) c c  Finger abd (T1) c c  Grossly     (Blank rows = not tested, score listed is out of 5 possible points.  N = WNL, D = diminished, C = clear for gross weakness with myotome testing, * = concordant pain with testing)   PATIENT SURVEYS:  Neck Disability Index: 26 / 50 = 52.0 %    TODAY'S TREATMENT:   [facility] Adult PT Treatment:                                                DATE: 06/13/2023   Therex: UBE 3 min ea fwd/back, level 2  Standing rows - 2x10 ea - unilateral row - 10# Standing pull downs - 7# - 2x10 ea - unilateral  Standing W w/ YTB - 2x10 Paloff press - 2x10 - 10# Shoulder rolls between sets of standing band exercises Scaption with back at wall to 90 - 2x10 - 2# Chin tuck into ball on wall - 2x10 - relieving - combined with Supine horizontal abduction, 3 x 10  Blue TB Bil shoulder ER with TB - 3x10 - Blue TB  Manual therapy: Skilled palpation to identify trigger points for TDN STM to all listed muscles following TDN  Trigger Point Dry Needling  Subsequent Treatment: Instructions provided previously at initial dry needling treatment.   Patient Verbal Consent Given: Yes Education Handout Provided: Previously Provided Muscles Treated: bil UT, bil sub occipitals, bil tx paraspinals ~T4 Electrical Stimulation Performed:  No Treatment Response/Outcome: twitch      HOME EXERCISE PROGRAM: Access Code: [email or web address] URL: [email or web address] Date: 03/18/2023 Prepared by: Arlester Bence  Exercises - Seated Scapular Retraction  - 2 x daily - 7 x weekly - 2 sets - 10 reps - 5 sec hold - Seated Thoracic Lumbar Extension  - 2 x daily - 7 x weekly - 3 sets - 10 reps - Supine Shoulder Horizontal Abduction with Resistance  - 1 x daily - 7 x weekly - 2 sets - 10 reps - Bilateral Shoulder Flexion Wall Slide with Towel  - 1 x daily - 7 x weekly - 2 sets - 10 reps - 3 sec hold  Treatment priorities   Eval 3/6       Thoracic mobility TDN       Manual/TDN Chin tuck relieving       Gentle periscapular/cervical strengthening General strengthening       Aquatic?                  ASSESSMENT:  CLINICAL IMPRESSION:    Dontrae has shown significant benefit since starting PT.  He reports ~ 30% improvement in sxs and improved ability to turn his head.  He has been able to consistently increase his volume and intensity of cervical and periscapular exercises.  He feels better able to manage his pain at home and plans to keep up with his exercises.  His cervical rotation is improved>25 degrees bil from evaluaion.  Despite minimal improvement in his ODI score he reports improved ability to complete daily task such as light housework and driving.  We will D/C with HEP; he agrees to plan.  EVAL: Kaelan is a 63 y.o. male who presents to clinic with signs  and sxs consistent with neck and upper thoracic pain.  Pt with long standing history of neck pain with decompression and fusion C3 -> C7.  Recent MRI shows no issue s with past fusion but some neural foraminal stenosis in the lower cervical spine.  He does have some sensory disturbance @ L C8 dermatome which is consistent with imaging.  Overall he shows increased muscle tone and bulk throughout Cx spine, UT, and LS.  He has concurrent low back pain which was not evaluated today d/t time constraints.  Kendrix will benefit from skilled therapy to address relevant deficits and improve comfort with completion of daily tasks.    OBJECTIVE IMPAIRMENTS: Pain, cervical ROM, UE strength  ACTIVITY LIMITATIONS: sitting, reading, driving  PERSONAL FACTORS: See medical history and pertinent history   REHAB POTENTIAL: Fair chronic pain; see past medical history  CLINICAL DECISION MAKING: Evolving/moderate complexity  EVALUATION COMPLEXITY: Moderate   GOALS:   SHORT TERM GOALS: Target date: 04/08/2023   Abhijot will be >75% HEP compliant to improve carryover between sessions and facilitate independent management of condition  Evaluation: ongoing Goal status: MET   LONG TERM GOALS: Target date: 05/06/2023 extended to 06/27/2023    Manjinder will self report >/= 50% decrease in pain from evaluation to improve function in daily tasks  Evaluation/Baseline: 9/10 max pain 4/11: 9/10 max, but less frequent 5/23: 30% improved Goal status: Partially MET   2.  Casson will show a >/= 20 pct improvement in their NDI score (MCID ~20% or 10/50 pts) as a proxy for functional improvement  Evaluation/Baseline: Neck Disability Index: 26 / 50 = 52.0 % 4/11: 52% 5/23: 25/50= 50% Goal status: Partially MET   3.  Arek will report confidence in self management  of condition at time of discharge with advanced HEP  Evaluation/Baseline: unable to self manage 4/11: ongoing 5/23: MET Goal status:  MET    PLAN: PT FREQUENCY: 1-2x/week  PT DURATION: 8 weeks  PLANNED INTERVENTIONS:  97164- PT Re-evaluation, 97110-Therapeutic exercises, 97530- Therapeutic activity, 97112- Neuromuscular re-education, 97535- Self Care, 40981- Manual therapy, [id number]- Gait training, [id number]- Aquatic Therapy, [phone]- Electrical stimulation (manual), [id number]- Vasopneumatic device, [id number]- Traction (mechanical), [id number]- Ionotophoresis 4mg /ml Dexamethasone , Taping, Dry Needling, Joint manipulation, and Spinal manipulation.   Josue Kass E Caryn Gienger PT 06/13/2023 2:04 PM

## 2023-10-22 ENCOUNTER — Ambulatory Visit: Admitting: Podiatry

## 2023-10-22 VITALS — Ht 69.5 in | Wt 235.4 lb

## 2023-10-22 DIAGNOSIS — L600 Ingrowing nail: Secondary | ICD-10-CM

## 2023-10-22 NOTE — Patient Instructions (Signed)

## 2023-10-22 NOTE — Progress Notes (Signed)
   Chief Complaint  Patient presents with   Ingrown Toenail    Pt is here due to ingrown on the right great toe.    Subjective: Patient presents today for evaluation of pain to the lateral border right great toe. Patient is concerned for possible ingrown nail.  It is very sensitive to touch.  Patient presents today for further treatment and evaluation.  Past Medical History:  Diagnosis Date   Arthritis    Asthma    CHF (congestive heart failure) (HCC)    Diabetes mellitus without complication (HCC)    Dyspnea    GERD (gastroesophageal reflux disease)    HLD (hyperlipidemia)    Hypertension    Pneumonia    1981   Seasonal allergies     Past Surgical History:  Procedure Laterality Date   CARDIOVASCULAR STRESS TEST  07/31/2020   Nuclear stress test 07/31/20 [facility]): Negative study for ischemia or scar at 96% maximal heart rate predicted by age.  Overall gated LV systolic function was normal, calculated EF 64% at stress and at rest.  No LV dilatation.   COLONOSCOPY     left arm surgery     POSTERIOR CERVICAL FUSION/FORAMINOTOMY N/A 07/16/2021   Procedure: Posterior cervical fusion with lateral mass fixation - Cervical Three-Cervical Seven,, cervical laminectomy  - Cervical Three-Cevical Six;  Surgeon: Joshua Alm RAMAN, MD;  Location: [facility] OR;  Service: Neurosurgery;  Laterality: N/A;    Allergies  Allergen Reactions   Vasotec [Enalapril Maleate] Itching and Tinitus    Objective:  General: Well developed, nourished, in no acute distress, alert and oriented x3   Dermatology: Skin is warm, dry and supple bilateral.  Lateral border right great toe is tender with evidence of an ingrowing nail. Pain on palpation noted to the border of the nail fold. The remaining nails appear unremarkable at this time.   Vascular: DP and PT pulses palpable.  No clinical evidence of vascular compromise  Neruologic: Grossly intact via light touch bilateral.  Musculoskeletal: No pedal deformity  noted  Assesement: #1 Paronychia with ingrowing nail lateral border right great toe  Plan of Care:  -Patient evaluated.  -Discussed treatment alternatives and plan of care. Explained nail avulsion procedure and post procedure course to patient. -Patient opted for permanent partial nail avulsion of the ingrown portion of the nail.  -Prior to procedure, local anesthesia infiltration utilized using 3 ml of a 50:50 mixture of 2% plain lidocaine  and 0.5% plain marcaine  in a normal hallux block fashion and a betadine prep performed.  -Partial permanent nail avulsion with chemical matrixectomy performed using 3x30sec applications of phenol followed by alcohol flush.  -Light dressing applied.  Post care instructions provided -Return to clinic 3 weeks  *from [place] are  Thresa EMERSON Sar, DPM [facility]  Dr. Thresa EMERSON Sar, DPM    [address] [place], [place] [address]                Office [phone]  Fax [phone]

## 2023-11-12 ENCOUNTER — Ambulatory Visit: Admitting: Podiatry

## 2023-11-12 ENCOUNTER — Encounter: Payer: Self-pay | Admitting: Podiatry

## 2023-11-12 VITALS — Ht 69.5 in | Wt 235.4 lb

## 2023-11-12 DIAGNOSIS — L600 Ingrowing nail: Secondary | ICD-10-CM

## 2023-11-12 NOTE — Progress Notes (Signed)
   Chief Complaint  Patient presents with   Ingrown Toenail    Pt is here to f/u on right great toenail after having ingrown removed he states that his toe feels a lot better.    Subjective: 63 y.o. male presents today status post permanent nail avulsion procedure of the lateral border right great toe that was performed on 10/22/2023.   Past Medical History:  Diagnosis Date   Arthritis    Asthma    CHF (congestive heart failure) (HCC)    Diabetes mellitus without complication (HCC)    Dyspnea    GERD (gastroesophageal reflux disease)    HLD (hyperlipidemia)    Hypertension    Pneumonia    1981   Seasonal allergies     Objective: Neurovascular status intact.  Skin is warm, dry and supple. Nail and respective nail fold appears to be healing appropriately.   Assessment: #1 s/p partial permanent nail matrixectomy lateral border right great toe.  10/22/2023   Plan of care: #1 patient was evaluated  #2 light debridement of the periungual debris was performed to the border of the respective toe and nail plate using a tissue nipper. #3 patient is to return to clinic 2 months for routine diabetic footcare   Thresa EMERSON Sar, DPM [facility]  Dr. Thresa EMERSON Sar, DPM    [address] [place], [place] [address]                Office [phone]  Fax [phone]

## 2024-02-12 ENCOUNTER — Ambulatory Visit: Admitting: Podiatry

## 2024-02-12 ENCOUNTER — Encounter: Payer: Self-pay | Admitting: Podiatry

## 2024-02-12 DIAGNOSIS — M79674 Pain in right toe(s): Secondary | ICD-10-CM | POA: Diagnosis not present

## 2024-02-12 DIAGNOSIS — B351 Tinea unguium: Secondary | ICD-10-CM

## 2024-02-12 DIAGNOSIS — M79675 Pain in left toe(s): Secondary | ICD-10-CM

## 2024-02-12 NOTE — Progress Notes (Signed)
 This patient presents to the office with chief complaint of long thick painful nails.  Patient says the nails are painful walking and wearing shoes.  This patient is unable to self treat.  This patient is unable to trim his  nails since she is unable to reach his  nails.  he presents to the office for preventative foot care services.  General Appearance  Alert, conversant and in no acute stress.  Vascular  Dorsalis pedis and posterior tibial  pulses are palpable  bilaterally.  Capillary return is within normal limits  bilaterally. Temperature is within normal limits  bilaterally.  Neurologic  Senn-Weinstein monofilament wire test within normal limits  bilaterally. Muscle power within normal limits bilaterally.  Nails Thick disfigured discolored nails with subungual debris  from hallux to fifth toes bilaterally. No evidence of bacterial infection or drainage bilaterally.  Orthopedic  No limitations of motion  feet .  No crepitus or effusions noted.  No bony pathology or digital deformities noted.  Skin  normotropic skin with no porokeratosis noted bilaterally.  No signs of infections or ulcers noted.     Onychomycosis  Nails  B/L.  Pain in right toes  Pain in left toes  Debridement of nails both feet followed trimming the nails with dremel tool.    RTC 3 months.   Cordella Bold DPM  lazarus

## 2024-05-12 ENCOUNTER — Ambulatory Visit: Admitting: Podiatry
